# Patient Record
Sex: Female | Born: 1960 | Race: Black or African American | Hispanic: No | Smoking: Never smoker
Health system: Southern US, Community
[De-identification: ages and names within clinical notes are randomized; demographics above are authoritative.]

## PROBLEM LIST (undated history)

## (undated) DIAGNOSIS — R600 Localized edema: Secondary | ICD-10-CM

## (undated) DIAGNOSIS — M1990 Unspecified osteoarthritis, unspecified site: Secondary | ICD-10-CM

## (undated) DIAGNOSIS — R7989 Other specified abnormal findings of blood chemistry: Secondary | ICD-10-CM

## (undated) DIAGNOSIS — R7303 Prediabetes: Secondary | ICD-10-CM

## (undated) DIAGNOSIS — I10 Essential (primary) hypertension: Secondary | ICD-10-CM

## (undated) DIAGNOSIS — T7840XA Allergy, unspecified, initial encounter: Secondary | ICD-10-CM

## (undated) DIAGNOSIS — S83209A Unspecified tear of unspecified meniscus, current injury, unspecified knee, initial encounter: Secondary | ICD-10-CM

## (undated) DIAGNOSIS — K219 Gastro-esophageal reflux disease without esophagitis: Secondary | ICD-10-CM

## (undated) DIAGNOSIS — M2550 Pain in unspecified joint: Secondary | ICD-10-CM

## (undated) DIAGNOSIS — M549 Dorsalgia, unspecified: Secondary | ICD-10-CM

## (undated) HISTORY — DX: Pain in unspecified joint: M25.50

## (undated) HISTORY — DX: Unspecified osteoarthritis, unspecified site: M19.90

## (undated) HISTORY — DX: Prediabetes: R73.03

## (undated) HISTORY — DX: Other specified abnormal findings of blood chemistry: R79.89

## (undated) HISTORY — DX: Allergy, unspecified, initial encounter: T78.40XA

## (undated) HISTORY — DX: Localized edema: R60.0

## (undated) HISTORY — DX: Essential (primary) hypertension: I10

## (undated) HISTORY — DX: Gastro-esophageal reflux disease without esophagitis: K21.9

## (undated) HISTORY — DX: Dorsalgia, unspecified: M54.9

## (undated) HISTORY — DX: Unspecified tear of unspecified meniscus, current injury, unspecified knee, initial encounter: S83.209A

## (undated) HISTORY — PX: GANGLION CYST EXCISION: SHX1691

## (undated) HISTORY — PX: TUBAL LIGATION: SHX77

## (undated) HISTORY — PX: HERNIA REPAIR: SHX51

---

## 2005-12-13 ENCOUNTER — Emergency Department (HOSPITAL_COMMUNITY): Admission: EM | Admit: 2005-12-13 | Discharge: 2005-12-13 | Payer: Self-pay | Admitting: Family Medicine

## 2008-07-25 ENCOUNTER — Encounter: Admission: RE | Admit: 2008-07-25 | Discharge: 2008-07-25 | Payer: Self-pay | Admitting: Internal Medicine

## 2011-03-24 ENCOUNTER — Emergency Department: Payer: Self-pay | Admitting: *Deleted

## 2014-03-14 ENCOUNTER — Emergency Department (HOSPITAL_COMMUNITY)
Admission: EM | Admit: 2014-03-14 | Discharge: 2014-03-14 | Disposition: A | Discharge status: Home / Self Care | Payer: No Typology Code available for payment source | Attending: Emergency Medicine | Admitting: Emergency Medicine

## 2014-03-14 ENCOUNTER — Emergency Department (HOSPITAL_COMMUNITY): Payer: No Typology Code available for payment source

## 2014-03-14 DIAGNOSIS — Z87828 Personal history of other (healed) physical injury and trauma: Secondary | ICD-10-CM | POA: Insufficient documentation

## 2014-03-14 DIAGNOSIS — Z880 Allergy status to penicillin: Secondary | ICD-10-CM | POA: Insufficient documentation

## 2014-03-14 DIAGNOSIS — M25561 Pain in right knee: Secondary | ICD-10-CM

## 2014-03-14 DIAGNOSIS — IMO0002 Reserved for concepts with insufficient information to code with codable children: Secondary | ICD-10-CM | POA: Insufficient documentation

## 2014-03-14 DIAGNOSIS — E669 Obesity, unspecified: Secondary | ICD-10-CM | POA: Insufficient documentation

## 2014-03-14 DIAGNOSIS — M1990 Unspecified osteoarthritis, unspecified site: Secondary | ICD-10-CM

## 2014-03-14 DIAGNOSIS — M1710 Unilateral primary osteoarthritis, unspecified knee: Secondary | ICD-10-CM | POA: Insufficient documentation

## 2014-03-14 MED ORDER — NAPROXEN 375 MG PO TABS: 375.0000 mg | ORAL_TABLET | Freq: Two times a day (BID) | ORAL | Status: DC

## 2014-03-14 NOTE — ED Notes (Signed)
Patient transported to X-ray 

## 2014-03-14 NOTE — Progress Notes (Signed)
  CARE MANAGEMENT ED NOTE 03/14/2014  Patient:  Becky Hall,Becky Hall   Account Number:  [id number]  Date Initiated:  03/14/2014  Documentation initiated by:  Radford PaxFERRERO,Arvis Miguez  Subjective/Objective Assessment:   Patient presents to ED with severe right knee pain.     Subjective/Objective Assessment Detail:     Action/Plan:   Action/Plan Detail:   Anticipated DC Date:  03/14/2014     Status Recommendation to Physician:   Result of Recommendation:    Other ED Services  Consult Working Plan    DC Planning Services  Other  PCP issues    Choice offered to / List presented to:            Status of service:  Completed, signed off  ED Comments:   ED Comments Detail:  EDCM spoke to patient at bedside.  As per patient [place]  System updated.

## 2014-03-14 NOTE — ED Notes (Signed)
Pt states burning knee pain on left side since last Tuesday.  Pt took fluid pain and it went away. Now having pain in right leg with swelling.  No heat to touch.

## 2014-03-14 NOTE — ED Provider Notes (Signed)
CSN: [id number]     Arrival date & time 03/14/14  1236 History  This chart was scribed for non-physician practitioner, Raymon MuttonMarissa Tymothy Cass, PA-C,working with Raeford RazorStephen Kohut, MD, by Karle PlumberJennifer Tensley, ED Scribe.  This patient was seen in room WTR7/WTR7 and the patient's care was started at 2:18 PM.  Chief Complaint  Patient presents with  . Knee Pain   The history is provided by the patient. No language interpreter was used.   HPI Comments:  Becky GoodnightWanda K Hall is a 53 y.o. obese female who presents to the Emergency Department complaining of severe right knee pain that started approximately one week ago. Pt states she started having the pain about two months ago but the symptoms went away. She states she recently helped move her mother in with her. She reports the pain is burning and feels like something sticking in it. She reports associated mild swelling and numbness of the knee. She states she fell on the knee several years ago, but has not had trouble with it in a very long time. She states she has taken a fluid pill with mild relief. She reports taking Ibuprofen, Tylenol Arthritis, Aleve, and tramadol with mild relief. She denies CP, SOB, or difficulty breathing, or hip pain. Denies any new trauma or injury. She states she does not currently have a PCP but is in the process of getting one.    No past medical history on file. No past surgical history on file. No family history on file. History  Substance Use Topics  . Smoking status: Not on file  . Smokeless tobacco: Not on file  . Alcohol Use: Not on file   OB History   No data available     Review of Systems  Respiratory: Negative for shortness of breath.   Cardiovascular: Negative for chest pain.  Musculoskeletal: Positive for joint swelling (right knee).  Neurological: Positive for numbness.  All other systems reviewed and are negative.   Allergies  Penicillins  Home Medications   Prior to Admission medications   Not on File    Triage Vitals: BP 122/85  Pulse 104  Temp(Src) 98.1 F (36.7 C) (Oral)  Resp 18  SpO2 96% Physical Exam  Nursing note and vitals reviewed. Constitutional: She is oriented to person, place, and time. She appears well-developed and well-nourished. No distress.  HENT:  Head: Normocephalic and atraumatic.  Mouth/Throat: Oropharynx is clear and moist. No oropharyngeal exudate.  Eyes: Conjunctivae and EOM are normal. Pupils are equal, round, and reactive to light. Right eye exhibits no discharge. Left eye exhibits no discharge.  Neck: Normal range of motion. Neck supple. No tracheal deviation present.  Cardiovascular: Normal rate, regular rhythm and normal heart sounds.   Pulses:      Radial pulses are 2+ on the right side, and 2+ on the left side.       Dorsalis pedis pulses are 2+ on the right side, and 2+ on the left side.       Posterior tibial pulses are 2+ on the right side, and 2+ on the left side.  Cap refill less than 3 seconds  Pulmonary/Chest: Effort normal and breath sounds normal. No respiratory distress. She has no wheezes. She has no rales.  Musculoskeletal: Normal range of motion. She exhibits tenderness.       Legs: Mild swelling identified to the medial aspect of the proximal right tib-fib region with negative erythema, warmth upon palpation, inflammation, discoloration. Soft upon palpation with negative pain. Negative findings of abscess.  Full range of motion to the right knee. Negative discomfort to the right knee upon palpation circumferentially. Stable right knee joint. Negative valgus and varus tension. Full range of motion to the digits of the right foot without difficulty.  Lymphadenopathy:    She has no cervical adenopathy.  Neurological: She is alert and oriented to person, place, and time. No cranial nerve deficit. She exhibits normal muscle tone. Coordination normal.  Cranial nerves III-XII grossly intact Strength 5+/5+ to upper and lower extremities bilaterally  with resistance applied, equal distribution noted Sensation intact with differentiation sharp and dull touch Gait proper, proper balance - negative sway, negative drift, negative step-offs  Skin: Skin is warm and dry. She is not diaphoretic.  Psychiatric: She has a normal mood and affect. Her behavior is normal.    ED Course  Procedures (including critical care time) DIAGNOSTIC STUDIES: Oxygen Saturation is 96% on RA, normal by my interpretation.   COORDINATION OF CARE: 2:25 PM- Will speak with Dr. Juleen ChinaKohut about appropriate course of treatment. Pt verbalizes understanding and agrees to plan.  Medications - No data to display  Labs Review Labs Reviewed - No data to display  Imaging Review Dg Knee Complete 4 Views Right  03/14/2014   CLINICAL DATA:  Right knee pain, swelling  EXAM: RIGHT KNEE - COMPLETE 4+ VIEW  COMPARISON:  None.  FINDINGS: Right knee medial compartment osteoarthritis noted with joint space loss and bony spurring. No malalignment, displaced fracture or effusion. Obese body habitus noted.  IMPRESSION: Right knee medial compartment osteoarthritis. No acute finding by an plain radiography   Electronically Signed   By: Ruel Favorsrevor  Shick M.D.   On: 03/14/2014 14:50     EKG Interpretation None      MDM   Final diagnoses:  Osteoarthritis  Right knee pain    Filed Vitals:   03/14/14 1255  BP: 122/85  Pulse: 104  Temp: 98.1 F (36.7 C)  TempSrc: Oral  Resp: 18  SpO2: 96%    I personally performed the services described in this documentation, which was scribed in my presence. The recorded information has been reviewed and is accurate.  Patient presenting to the ED with right knee pain that started approximately one week ago. Stated that she was moving her mother was moving stuff around that could have exacerbated pain. Stated the pain is localized just below her right knee described as a burning, sticking needle sensation without radiation. Stated that she's been  using Aleve, ibuprofen, Tylenol arthritis with minimal relief. Stated that she's noticed mild swelling. Denies skin trouble or changes, warmth, fall, trauma, numbness, tingling, loss of sensation. Alert and oriented. GCS 15. Heart rate and rhythm normal. Lungs clear to auscultation. Radial and DP pulses 2+ bilaterally, PT pulses 2+. Cap refill less than 3 seconds. Negative deformities identified to the right knee-negative pain upon palpation circumferentially. Negative findings of septic joint. Mild swelling identified to the medial aspect of the proximal tib-fib region of the right leg. Negative pain upon palpation-negative erythema, inflammation, warmth upon palpation, cellulitic findings. Negative findings of abscess. Sensation intact with differentiation sharp and dull touch. Strength intact with equal distribution. Full range of motion to the right knee. Stable knee joint. Full range of motion to the digits of the right foot. Plain film of right knee noted medial compartment osteoarthritis-no acute finding for osseous injury. Doubt tibial plateau fracture. Doubt acute bony abnormality. Doubt septic joint. Patient presenting to the ED with osteoarthritis-suspicious swelling secondary to osteoarthritis. Patient stable, afebrile.  Patient neurovascularly intact. Patient placed in the sleeve for comfort. Discharged patient. Referred patient to orthopedics. Discussed with patient to rest, ice, elevate. Discharge patient with anti-inflammatories. Discussed with patient to closely monitor symptoms and if symptoms are to worsen or change to report back to the ED - strict return instructions given.  Patient agreed to plan of care, understood, all questions answered.   Raymon Mutton, PA-C 03/14/14 2155

## 2014-03-14 NOTE — Discharge Instructions (Signed)
Please call your doctor for a followup appointment within 24-48 hours. When you talk to your doctor please let them know that you were seen in the emergency department and have them acquire all of your records so that they can discuss the findings with you and formulate a treatment plan to fully care for your new and ongoing problems. Please call and set-up an appointment with orthopedics to be assessed regarding osteoarthritis in the right knee Please apply ice and massage with icy hot ointment Please take medications as prescribed-while on these medications please do not take any the or ibuprofen for this can increase the risk of GI bleeds Please continue to monitor symptoms closely and if symptoms are to worsen or change (fever greater than 101, chills, chest pain, shortness of breath, difficulty breathing, fall, injury, swelling to the leg, redness, numbness, tingling, warmth upon palpation, red streaks running up the leg) please report back to the ED immediately   Osteoarthritis Osteoarthritis is a disease that causes soreness and swelling (inflammation) of a joint. It occurs when the cartilage at the affected joint wears down. Cartilage acts as a cushion, covering the ends of bones where they meet to form a joint. Osteoarthritis is the most common form of arthritis. It often occurs in older people. The joints affected most often by this condition include those in the:  Ends of the fingers.  Thumbs.  Neck.  Lower back.  Knees.  Hips. CAUSES  Over time, the cartilage that covers the ends of bones begins to wear away. This causes bone to rub on bone, producing pain and stiffness in the affected joints.  RISK FACTORS Certain factors can increase your chances of having osteoarthritis, including:  Older age.  Excessive body weight.  Overuse of joints. SIGNS AND SYMPTOMS   Pain, swelling, and stiffness in the joint.  Over time, the joint may lose its normal shape.  Small deposits  of bone (osteophytes) may grow on the edges of the joint.  Bits of bone or cartilage can break off and float inside the joint space. This may cause more pain and damage. DIAGNOSIS  Your health care provider will do a physical exam and ask about your symptoms. Various tests may be ordered, such as:  X-rays of the affected joint.  An MRI scan.  Blood tests to rule out other types of arthritis.  Joint fluid tests. This involves using a needle to draw fluid from the joint and examining the fluid under a microscope. TREATMENT  Goals of treatment are to control pain and improve joint function. Treatment plans may include:  A prescribed exercise program that allows for rest and joint relief.  A weight control plan.  Pain relief techniques, such as:  Properly applied heat and cold.  Electric pulses delivered to nerve endings under the skin (transcutaneous electrical nerve stimulation, TENS).  Massage.  Certain nutritional supplements.  Medicines to control pain, such as:  Acetaminophen.  Nonsteroidal anti-inflammatory drugs (NSAIDs), such as naproxen.  Narcotic or central-acting agents, such as tramadol.  Corticosteroids. These can be given orally or as an injection.  Surgery to reposition the bones and relieve pain (osteotomy) or to remove loose pieces of bone and cartilage. Joint replacement may be needed in advanced states of osteoarthritis. HOME CARE INSTRUCTIONS   Only take over-the-counter or prescription medicines as directed by your health care provider. Take all medicines exactly as instructed.  Maintain a healthy weight. Follow your health care provider's instructions for weight control. This may include  dietary instructions.  Exercise as directed. Your health care provider can recommend specific types of exercise. These may include:  Strengthening exercises These are done to strengthen the muscles that support joints affected by arthritis. They can be performed  with weights or with exercise bands to add resistance.  Aerobic activities These are exercises, such as brisk walking or low-impact aerobics, that get your heart pumping.  Range-of-motion activities These keep your joints limber.  Balance and agility exercises These help you maintain daily living skills.  Rest your affected joints as directed by your health care provider.  Follow up with your health care provider as directed. SEEK MEDICAL CARE IF:   Your skin turns red.  You develop a rash in addition to your joint pain.  You have worsening joint pain. SEEK IMMEDIATE MEDICAL CARE IF:  You have a significant loss of weight or appetite.  You have a fever along with joint or muscle aches.  You have night sweats. FOR MORE INFORMATION  National Institute of Arthritis and Musculoskeletal and Skin Diseases: www.niams.http://www.myers.net/nih.gov [employer]ational Institute on Aging: https://walker.com/www.nia.nih.gov American College of Rheumatology: www.rheumatology.org Document Released: 11/11/2005 Document Revised: 09/01/2013 Document Reviewed: 07/19/2013 [facility] Patient Information 2014 [place], [place].   Emergency Department Resource Guide 1) Find a Doctor and Pay Out of Pocket Although you won't have to find out who is covered by your insurance plan, it is a good idea to ask around and get recommendations. You will then need to call the office and see if the doctor you have chosen will accept you as a new patient and what types of options they offer for patients who are self-pay. Some doctors offer discounts or will set up payment plans for their patients who do not have insurance, but you will need to ask so you aren't surprised when you get to your appointment.  2) Contact Your Local Health Department Not all health departments have doctors that can see patients for sick visits, but many do, so it is worth a call to see if yours does. If you don't know where your local health department is, you can check in your phone book.  The CDC also has a tool to help you locate your state's health department, and many state websites also have listings of all of their local health departments.  3) Find a Walk-in Clinic If your illness is not likely to be very severe or complicated, you may want to try a walk in clinic. These are popping up all over the country in pharmacies, drugstores, and shopping centers. They're usually staffed by nurse practitioners or physician assistants that have been trained to treat common illnesses and complaints. They're usually fairly quick and inexpensive. However, if you have serious medical issues or chronic medical problems, these are probably not your best option.  No Primary Care Doctor: - Call Health Connect at  [phone] - they can help you locate a primary care doctor that  accepts your insurance, provides certain services, etc. - Physician Referral Service- [phone]  Chronic Pain Problems: Organization         Address  Phone   Notes  [facility]  [phone] Patients need to be referred by their primary care doctor.   Medication Assistance: Organization         Address  Phone   Notes  [facility] Medication [facility] [address] [place], [place] [address] [phone] --Must be a resident of [facility] [place] -- Must have NO  insurance coverage whatsoever (no Medicaid/ Medicare, etc.) -- The pt. MUST have a primary care doctor that directs their care regularly and follows them in the community   Med[facility]ist  [phone]   [employer]  [phone]    Agencies that provide inexpensive medical care: Organization         Address  Phone   Notes  [facility]  [phone]   [facility]    [phone]   [facility] [address] [place], [place] [address] [phone]   [facility] [address], [place] [phone]   [facility]     [phone]   [facility]    [phone]   [facility]  [address], [place] Phone:  [phone], Fax:  [phone] Hours of Operation:  9 am - 6 pm, M-F.  Also accepts Medicaid/Medicare and self-pay.  [facility]  [address], [place] Phone: [phone], Fax: [phone]. Hours of Operation:  8:30 am - 5:30 pm, M-F.  Also accepts Medicaid and self-pay.  [facility] [place] [address], [place] Phone: [phone]) [phone]   [facility] [address], [place], [place] [phone], Ext. [phone] Mondays & Thursdays: 7-9 AM.  First 15 patients are seen on a first come, first serve basis.    Medicaid-accepting [facility] Providers:  Organization         Address  Phone   Notes  [facility] [address], [place] [phone] Also accepts self-pay patients.  [facility] [address], [place]  [phone]) [phone]   [facility] [address], [place] [phone]   [facility] [address] [place] [address], [place] [phone]   Renaye RakersVeita Bland [address], [place]   [phone] Only accepts [place] Access [place] patients after they have their name applied to their card.   Self-Pay (no insurance) in [place] [place]:  Organization         Address  Phone   Notes  Sickle Cell Patients, [facility] [address], [place] [phone]   [facility] [address], [place] [phone]   [facility] [place]  [address], [place] [phone]   [facility]/Dr. Osei-Bonsu  [address] [place] [place] [address], [place] or [phone], [place] [phone] Phone number for both [place] and [place] locations is the same.  [facility] and  [facility] [address], [place] [phone]   [facility] [place] [address] [place] [address], [place] or [address] [phone]   [facility] [address], [place] [phone], phone; [phone], fax Sees patients 1st and 3rd Saturday of every month.  Must not qualify for public or private insurance (i.e. Medicaid, Medicare, [place] Health Choice, Veterans' Benefits)  Household income should be no more than 200% of the poverty level The clinic cannot treat you if you are pregnant or think you are pregnant  Sexually transmitted diseases are not treated at the clinic.    Dental Care: Organization         Address  Phone  Notes  [facility]  Department of Public Health [facility] [address] [place], [place] [phone] Accepts children up to age 61 who are enrolled in [place] or [place] Health Choice; pregnant women with a Medicaid card; and children who have applied for Medicaid or [place] Health Choice, but were declined, whose parents can pay a reduced fee at time of service.  [facility] Department of [facility]  [address], [place] [phone] Accepts children up to age 22 who are enrolled in [place] or [place] Health Choice; pregnant women with a Medicaid card; and children who have applied for Medicaid or [place] Health Choice, but were declined, whose parents can pay a reduced fee at time of service.  [facility]  [address] [place], [place] [phone] Patients are seen by appointment only. Walk-ins are not accepted. [facility] will see patients 32 years of age and older. Monday - Tuesday (8am-5pm) Most Wednesdays (8:30-5pm) $30 per visit, cash only  [facility]  [address], [facility] [phone] Patients are seen by appointment only. Walk-ins are not accepted. [facility] will see patients 29 years of age and  older. One Wednesday Evening (Monthly: Volunteer Based).  $30 per visit, cash only  [employer]  [phone] for adults; Children under age 66, call Graduate Pediatric Dentistry at [phone]. Children aged 93-14, please call [phone] to request a pediatric application.  Dental services are provided in all areas of dental care including fillings, crowns and bridges, complete and partial dentures, implants, gum treatment, root canals, and extractions. Preventive care is also provided. Treatment is provided to both adults and children. Patients are selected via a lottery and there is often a waiting list.   [facility] [address] [place]  [phone] www.drcivils.com   [facility] [address] [place], [place] [phone], Ext. [phone] Second and Fourth Thursday of each month, opens at 6:30 AM; Clinic ends at 9 AM.  Patients are seen on a first-come first-served basis, and a limited number are seen during each clinic.   [facility]  [address] [place], [place] [phone]   Eligibility Requirements You must have lived in [place], [place], or [place] counties for at least the last three months.   You cannot be eligible for state or federal sponsored [place], including [employer], [place], or [employer].   You generally cannot be eligible for healthcare insurance through your employer.    How to apply: Eligibility screenings are held every Tuesday and Wednesday afternoon from 1:00 pm until 4:00 pm. You do not need an appointment for the interview!  [facility] [address] [place], [place] [phone]   [facility]  [phone]   [facility] Health Department  [phone]   [facility] Health Department  [phone]    Behavioral Health Resources in the Community: Intensive Outpatient Programs Organization          Address  Phone  Notes  [facility] [address], [place], [place] [phone]   [facility] Outpatient [address], [place], [place] [phone]   [facility] [address] [place], [place]  [phone]   [facility] Mental Health [address]  [place], [place] [phone] or [phone]   Substance Abuse Resources Organization  Address  Phone  Notes  Alcohol [facility] Drug Services  [phone]   [facility]  [phone]   [facility] [place]  [phone]   Floydene Flock  [phone]   Residential & Outpatient Substance Abuse Program  [phone]   Psychological Services Organization         Address  Phone  Notes  [facility] Health  [phone]   [facility]  [phone]   [facility] Mental [facility] [address] [place] [phone] or [phone]    Mobile Crisis Teams Organization         Address  Phone  Notes  [facility], Mobile Crisis Care Unit  [phone]   [facility]  [address] [place], [place] [phone]   Doristine Locks [address] [place] [phone]    Self-Help/Support Groups Organization         Address  Phone             Notes  [facility]oc[facility] of [place] - variety of support groups  [phone]- I7437963 Call for more information  Narcotics Anonymous (NA), [facility] [address], [employer] [place]  2 meetings at this location   Statistician         Address  Phone  Notes  [facility] [address],    [place]  [phone]   [facility]  [address] [place] [address], [place], [place] [phone]   [facility] [address] [place], [place] [phone] Admissions: 8am-3pm M-F  [facility] [address],    [place], [place] [phone]   [facility] [address] [place], [place], [place] [phone]   [facility] [address],  [place], [place] [phone]   [facility] - Intensive Outpatient [address], [place], [place] [phone]   [facility] ([facility] [address] [place].,  [place], [place] [phone] or [phone]   [facility] ([facility]) [address], [place], [place] [phone] Accepts Medicaid  [facility] [address]  [place] [phone] Substance Abuse/Addiction Treatment   [facility] Organization         Address  Phone  Notes  [facility]  [phone]   Angie Fava, PhD [address] [place], [place]   7184524950 or [phone]   [facility]   [address] [place], [place] [phone]   [facility] [address], [place], [place] [phone] Insurance/Medicaid/sponsorship through [facility] Families [address]                                    [place], [place] [phone] Therapy/tele-psych/case  [facility] [address], [place] [phone]    Dr. Lolly Mustache  [phone]   [facility]  [employer] [facility] Behavioral Health Dept. 1) [address], [place] 2) [address] [place] 3)  [address], [place] [phone]  [phone]   [facility] Child Abuse Hotline 951 445 5794 or [phone] (After Hours)

## 2014-03-15 NOTE — ED Provider Notes (Signed)
Medical screening examination/treatment/procedure(s) were performed by non-physician practitioner and as supervising physician I was immediately available for consultation/collaboration.   EKG Interpretation None       Jule Schlabach, MD 03/15/14 0700 

## 2015-02-10 ENCOUNTER — Other Ambulatory Visit: Payer: Self-pay | Admitting: Podiatry

## 2015-02-10 ENCOUNTER — Ambulatory Visit (INDEPENDENT_AMBULATORY_CARE_PROVIDER_SITE_OTHER): Payer: No Typology Code available for payment source

## 2015-02-10 ENCOUNTER — Ambulatory Visit (INDEPENDENT_AMBULATORY_CARE_PROVIDER_SITE_OTHER): Payer: No Typology Code available for payment source | Admitting: Podiatry

## 2015-02-10 ENCOUNTER — Encounter: Payer: Self-pay | Admitting: Podiatry

## 2015-02-10 VITALS — BP 120/89 | HR 96 | Resp 16

## 2015-02-10 DIAGNOSIS — M722 Plantar fascial fibromatosis: Secondary | ICD-10-CM

## 2015-02-10 DIAGNOSIS — S92912A Unspecified fracture of left toe(s), initial encounter for closed fracture: Secondary | ICD-10-CM

## 2015-02-10 DIAGNOSIS — M79673 Pain in unspecified foot: Secondary | ICD-10-CM

## 2015-02-10 MED ORDER — TRIAMCINOLONE ACETONIDE 10 MG/ML IJ SUSP
10.0000 mg | Freq: Once | INTRAMUSCULAR | Status: AC
  Administered 2015-02-10: 10 mg

## 2015-02-10 MED ORDER — DICLOFENAC SODIUM 75 MG PO TBEC: 75.0000 mg | DELAYED_RELEASE_TABLET | Freq: Two times a day (BID) | ORAL | Status: DC

## 2015-02-10 NOTE — Patient Instructions (Signed)

## 2015-02-10 NOTE — Progress Notes (Signed)
   Subjective:    Patient ID: Becky Hall, female    DOB: 1961-10-17, 54 y.o.   MRN: [id number]  HPI Comments: "I hurt my toe and the heel hurts on the other side"  Patient c/o tender 4th toe and forefoot left since Feb 10th. She hit her toe and its red and swollen.   Also, c/o plantar heel right for few weeks. She thinks the heel started to hurt because of putting too much pressure on it to compensate for left. AM pain.   Foot Pain      Review of Systems  HENT: Positive for sinus pressure.   Cardiovascular: Positive for leg swelling.  All other systems reviewed and are negative.      Objective:   Physical Exam        Assessment & Plan:

## 2015-02-10 NOTE — Progress Notes (Signed)
Subjective:     Patient ID: Becky Hall, female   DOB: [birth year], 54 y.o.   MRN: [id number]  HPI patient states I traumatized my left big toe in the last couple of weeks and my heel has started to hurt me over the last several weeks probably due to walking differently. States that she banged her toe into a wall and it's been sore and swollen since and that her right heel has been very tender for these last 2 weeks   Review of Systems  All other systems reviewed and are negative.      Objective:   Physical Exam  Constitutional: She is oriented to person, place, and time.  Cardiovascular: Intact distal pulses.   Musculoskeletal: Normal range of motion.  Neurological: She is oriented to person, place, and time.  Skin: Skin is warm.  Nursing note and vitals reviewed.  neurovascular status found to be intact with muscle strength adequate and range of motion within normal limits. Patient's noted to have an edematous fourth toe left it's painful when pressed and pain in the plantar aspect of the right heel at the insertion of the tendon into the calcaneus. Significant flatfoot deformity is noted bilateral and patient is noted to have good digital perfusion and is well oriented 3     Assessment:     Probable fracture fourth toe left with plantar fasciitis of an acute nature right secondary to foot structure    Plan:     H&P and x-rays reviewed of feet. Discussed fracture toe and the fact it should heal based on where it is. The plantar heel right I injected 3 mg Kenalog 5 mg Xylocaine dispensed a fascially brace with instructions on usage and placed on diclofenac 75 mg twice a day. Gave instructions on physical therapy and supportive shoe gear usage and reappoint in 2 weeks

## 2015-02-24 ENCOUNTER — Encounter: Payer: Self-pay | Admitting: Podiatry

## 2015-02-24 ENCOUNTER — Ambulatory Visit (INDEPENDENT_AMBULATORY_CARE_PROVIDER_SITE_OTHER): Payer: No Typology Code available for payment source | Admitting: Podiatry

## 2015-02-24 VITALS — BP 129/90 | HR 84 | Resp 16

## 2015-02-24 DIAGNOSIS — M722 Plantar fascial fibromatosis: Secondary | ICD-10-CM | POA: Diagnosis not present

## 2015-02-27 NOTE — Progress Notes (Signed)
Subjective:     Patient ID: Becky Hall, female   DOB: Oct 25, 1961, 54 y.o.   MRN: [id number]  HPI patient states it's feeling much better on my right foot and my left fourth toe while still swollen is improving. States that she's able to walk with minimal discomfort at this time   Review of Systems     Objective:   Physical Exam Neurovascular status is intact with no change in health history and discomfort of the plantar heel right which is improved quite a bit upon palpation with mild swelling still noted left fourth toe    Assessment:     Plantar fasciitis right which is improving and fractured fourth toe left which is gradually getting better    Plan:     Advised on continued Y choose left and physical therapy and support for the right. If symptoms persist patient be seen back and may require more aggressive treatment plan

## 2015-04-14 ENCOUNTER — Telehealth: Payer: Self-pay | Admitting: *Deleted

## 2015-04-14 ENCOUNTER — Encounter: Payer: Self-pay | Admitting: *Deleted

## 2015-04-14 NOTE — Telephone Encounter (Signed)
Faxed patients return to work note to [phone]

## 2015-04-29 ENCOUNTER — Other Ambulatory Visit: Payer: Self-pay | Admitting: Podiatry

## 2015-08-15 ENCOUNTER — Encounter: Payer: Self-pay | Admitting: Podiatry

## 2015-08-15 ENCOUNTER — Ambulatory Visit (INDEPENDENT_AMBULATORY_CARE_PROVIDER_SITE_OTHER): Payer: Self-pay | Admitting: Podiatry

## 2015-08-15 ENCOUNTER — Ambulatory Visit (INDEPENDENT_AMBULATORY_CARE_PROVIDER_SITE_OTHER): Payer: Self-pay

## 2015-08-15 VITALS — BP 130/91 | HR 93 | Resp 17

## 2015-08-15 DIAGNOSIS — M79671 Pain in right foot: Secondary | ICD-10-CM

## 2015-08-15 DIAGNOSIS — M722 Plantar fascial fibromatosis: Secondary | ICD-10-CM

## 2015-08-15 MED ORDER — MELOXICAM 15 MG PO TABS: 15.0000 mg | ORAL_TABLET | Freq: Every day | ORAL | Status: DC

## 2015-08-15 NOTE — Progress Notes (Signed)
Patient ID: Becky Hall, female   DOB: 1961-08-11, 54 y.o.   MRN: [id number]  Subjective:  54 year old female presents to the office they with concerns of right heel pain which is started to recur of the left couple weeks. She states the pain for about the she changes shoes at a new job. She previously was treated for plantar fasciitis in February of this year and she states that she feels the same pain that she did then. She denies any history of injury or trauma. Denies any swelling or redness. No tingling or numbness. The pain does not wake her up at night. She has pain in the mornings when she first gets up or after being on her feet for long periods of time at work. No other complaints at this time in no acute changes otherwise.  Objective: AAO x3, NAD DP/PT pulses palpable bilaterally, CRT less than 3 seconds Protective sensation intact with Simms Weinstein monofilament, vibratory sensation intact, Achilles tendon reflex intact Tenderness to palpation overlying the plantar medial tubercle of the calcaneus to the right heel at the insertion of the plantar fascia. There is no pain along the course of plantar fascial within the arch of the foot. There is no pain with lateral compression of the calcaneus or pain the vibratory sensation. No pain on the posterior aspect of the calcaneus or along the course/insertion of the Achilles tendon. There is no overlying edema, erythema, increase in warmth. No other areas of tenderness palpation or pain with vibratory sensation to the foot/ankle. MMT 5/5, ROM WNL No open lesions or pre-ulcerative lesions are identified. No pain with calf compression, swelling, warmth, erythema.  Assessment: 54 year old female with likely recurrence of right plantar fasciitis  Plan: -Treatment options discussed including all alternatives, risks, and complications -Patient elects to proceed with steroid injection into the right heel. Under sterile skin preparation, a  total of 2.5cc of kenalog 10, 0.5% Marcaine plain, and 2% lidocaine plain were infiltrated into the symptomatic area without complication. A band-aid was applied. Patient tolerated the injection well without complication. Post-injection care with discussed with the patient. Discussed with the patient to ice the area over the next couple of days to help prevent a steroid flare.  -Continue of plantar fascial brace for Ciardi has. -Prescribed mobic. Discussed side effects of the medication and directed to stop if any are to occur and call the office.  -Icing and stretching exercises daily. -Continue with supportive shoe gear. -I discussed with her orthotics. -Follow-up in 4 weeks or sooner if any problems arise. In the meantime, encouraged to call the office with any questions, concerns, change in symptoms.   Ovid Curd, DPM

## 2015-08-15 NOTE — Patient Instructions (Signed)
Plantar Fasciitis (Heel Spur Syndrome) with Rehab The plantar fascia is a fibrous, ligament-like, soft-tissue structure that spans the bottom of the foot. Plantar fasciitis is a condition that causes pain in the foot due to inflammation of the tissue. SYMPTOMS   Pain and tenderness on the underneath side of the foot.  Pain that worsens with standing or walking. CAUSES  Plantar fasciitis is caused by irritation and injury to the plantar fascia on the underneath side of the foot. Common mechanisms of injury include:  Direct trauma to bottom of the foot.  Damage to a small nerve that runs under the foot where the main fascia attaches to the heel bone.  Stress placed on the plantar fascia due to bone spurs. RISK INCREASES WITH:   Activities that place stress on the plantar fascia (running, jumping, pivoting, or cutting).  Poor strength and flexibility.  Improperly fitted shoes.  Tight calf muscles.  Flat feet.  Failure to warm-up properly before activity.  Obesity. PREVENTION  Warm up and stretch properly before activity.  Allow for adequate recovery between workouts.  Maintain physical fitness:  Strength, flexibility, and endurance.  Cardiovascular fitness.  Maintain a health body weight.  Avoid stress on the plantar fascia.  Wear properly fitted shoes, including arch supports for individuals who have flat feet. PROGNOSIS  If treated properly, then the symptoms of plantar fasciitis usually resolve without surgery. However, occasionally surgery is necessary. RELATED COMPLICATIONS   Recurrent symptoms that may result in a chronic condition.  Problems of the lower back that are caused by compensating for the injury, such as limping.  Pain or weakness of the foot during push-off following surgery.  Chronic inflammation, scarring, and partial or complete fascia tear, occurring more often from repeated injections. TREATMENT  Treatment initially involves the use of  ice and medication to help reduce pain and inflammation. The use of strengthening and stretching exercises may help reduce pain with activity, especially stretches of the Achilles tendon. These exercises may be performed at home or with a therapist. Your caregiver may recommend that you use heel cups of arch supports to help reduce stress on the plantar fascia. Occasionally, corticosteroid injections are given to reduce inflammation. If symptoms persist for greater than 6 months despite non-surgical (conservative), then surgery may be recommended.  MEDICATION   If pain medication is necessary, then nonsteroidal anti-inflammatory medications, such as aspirin and ibuprofen, or other minor pain relievers, such as acetaminophen, are often recommended.  Do not take pain medication within 7 days before surgery.  Prescription pain relievers may be given if deemed necessary by your caregiver. Use only as directed and only as much as you need.  Corticosteroid injections may be given by your caregiver. These injections should be reserved for the most serious cases, because they may only be given a certain number of times. HEAT AND COLD  Cold treatment (icing) relieves pain and reduces inflammation. Cold treatment should be applied for 10 to 15 minutes every 2 to 3 hours for inflammation and pain and immediately after any activity that aggravates your symptoms. Use ice packs or massage the area with a piece of ice (ice massage).  Heat treatment may be used prior to performing the stretching and strengthening activities prescribed by your caregiver, physical therapist, or athletic trainer. Use a heat pack or soak the injury in warm water. SEEK IMMEDIATE MEDICAL CARE IF:  Treatment seems to offer no benefit, or the condition worsens.  Any medications produce adverse side effects. EXERCISES RANGE   OF MOTION (ROM) AND STRETCHING EXERCISES - Plantar Fasciitis (Heel Spur Syndrome) These exercises may help you  when beginning to rehabilitate your injury. Your symptoms may resolve with or without further involvement from your physician, physical therapist or athletic trainer. While completing these exercises, remember:   Restoring tissue flexibility helps normal motion to return to the joints. This allows healthier, less painful movement and activity.  An effective stretch should be held for at least 30 seconds.  A stretch should never be painful. You should only feel a gentle lengthening or release in the stretched tissue. RANGE OF MOTION - Toe Extension, Flexion  Sit with your right / left leg crossed over your opposite knee.  Grasp your toes and gently pull them back toward the top of your foot. You should feel a stretch on the bottom of your toes and/or foot.  Hold this stretch for __________ seconds.  Now, gently pull your toes toward the bottom of your foot. You should feel a stretch on the top of your toes and or foot.  Hold this stretch for __________ seconds. Repeat __________ times. Complete this stretch __________ times per day.  RANGE OF MOTION - Ankle Dorsiflexion, Active Assisted  Remove shoes and sit on a chair that is preferably not on a carpeted surface.  Place right / left foot under knee. Extend your opposite leg for support.  Keeping your heel down, slide your right / left foot back toward the chair until you feel a stretch at your ankle or calf. If you do not feel a stretch, slide your bottom forward to the edge of the chair, while still keeping your heel down.  Hold this stretch for __________ seconds. Repeat __________ times. Complete this stretch __________ times per day.  STRETCH - Gastroc, Standing  Place hands on wall.  Extend right / left leg, keeping the front knee somewhat bent.  Slightly point your toes inward on your back foot.  Keeping your right / left heel on the floor and your knee straight, shift your weight toward the wall, not allowing your back to  arch.  You should feel a gentle stretch in the right / left calf. Hold this position for __________ seconds. Repeat __________ times. Complete this stretch __________ times per day. STRETCH - Soleus, Standing  Place hands on wall.  Extend right / left leg, keeping the other knee somewhat bent.  Slightly point your toes inward on your back foot.  Keep your right / left heel on the floor, bend your back knee, and slightly shift your weight over the back leg so that you feel a gentle stretch deep in your back calf.  Hold this position for __________ seconds. Repeat __________ times. Complete this stretch __________ times per day. STRETCH - Gastrocsoleus, Standing  Note: This exercise can place a lot of stress on your foot and ankle. Please complete this exercise only if specifically instructed by your caregiver.   Place the ball of your right / left foot on a step, keeping your other foot firmly on the same step.  Hold on to the wall or a rail for balance.  Slowly lift your other foot, allowing your body weight to press your heel down over the edge of the step.  You should feel a stretch in your right / left calf.  Hold this position for __________ seconds.  Repeat this exercise with a slight bend in your right / left knee. Repeat __________ times. Complete this stretch __________ times per day.    STRENGTHENING EXERCISES - Plantar Fasciitis (Heel Spur Syndrome)  These exercises may help you when beginning to rehabilitate your injury. They may resolve your symptoms with or without further involvement from your physician, physical therapist or athletic trainer. While completing these exercises, remember:   Muscles can gain both the endurance and the strength needed for everyday activities through controlled exercises.  Complete these exercises as instructed by your physician, physical therapist or athletic trainer. Progress the resistance and repetitions only as guided. STRENGTH -  Towel Curls  Sit in a chair positioned on a non-carpeted surface.  Place your foot on a towel, keeping your heel on the floor.  Pull the towel toward your heel by only curling your toes. Keep your heel on the floor.  If instructed by your physician, physical therapist or athletic trainer, add ____________________ at the end of the towel. Repeat __________ times. Complete this exercise __________ times per day. STRENGTH - Ankle Inversion  Secure one end of a rubber exercise band/tubing to a fixed object (table, pole). Loop the other end around your foot just before your toes.  Place your fists between your knees. This will focus your strengthening at your ankle.  Slowly, pull your big toe up and in, making sure the band/tubing is positioned to resist the entire motion.  Hold this position for __________ seconds.  Have your muscles resist the band/tubing as it slowly pulls your foot back to the starting position. Repeat __________ times. Complete this exercises __________ times per day.  Document Released: 11/11/2005 Document Revised: 02/03/2012 Document Reviewed: 02/23/2009 Exit[employer] Patient Information 2015 [employer]. This information is not intended to replace advice given to you by your health care Becky Hall. Make sure you discuss any questions you have with your health care Becky Hall.  

## 2015-08-15 NOTE — Addendum Note (Signed)
Addended by: Ovid Curd R on: 08/15/2015 12:52 PM   Modules accepted: Orders

## 2015-08-22 ENCOUNTER — Ambulatory Visit: Payer: No Typology Code available for payment source | Admitting: Podiatry

## 2015-09-05 ENCOUNTER — Ambulatory Visit: Payer: Self-pay | Admitting: Podiatry

## 2015-09-07 ENCOUNTER — Ambulatory Visit: Payer: Self-pay | Admitting: Podiatry

## 2015-10-23 ENCOUNTER — Encounter: Payer: Self-pay | Admitting: Podiatry

## 2015-10-23 ENCOUNTER — Ambulatory Visit (INDEPENDENT_AMBULATORY_CARE_PROVIDER_SITE_OTHER): Payer: BLUE CROSS/BLUE SHIELD | Admitting: Podiatry

## 2015-10-23 ENCOUNTER — Ambulatory Visit: Payer: Self-pay | Admitting: Podiatry

## 2015-10-23 ENCOUNTER — Ambulatory Visit (INDEPENDENT_AMBULATORY_CARE_PROVIDER_SITE_OTHER): Payer: BLUE CROSS/BLUE SHIELD | Admitting: Internal Medicine

## 2015-10-23 VITALS — BP 117/79 | HR 99 | Resp 16

## 2015-10-23 VITALS — BP 108/72 | HR 101 | Temp 98.1°F | Resp 18 | Ht 66.0 in | Wt 227.0 lb

## 2015-10-23 DIAGNOSIS — Z111 Encounter for screening for respiratory tuberculosis: Secondary | ICD-10-CM | POA: Diagnosis not present

## 2015-10-23 DIAGNOSIS — M722 Plantar fascial fibromatosis: Secondary | ICD-10-CM | POA: Diagnosis not present

## 2015-10-23 DIAGNOSIS — Z7189 Other specified counseling: Secondary | ICD-10-CM | POA: Diagnosis not present

## 2015-10-23 DIAGNOSIS — M79671 Pain in right foot: Secondary | ICD-10-CM

## 2015-10-23 MED ORDER — TRIAMCINOLONE ACETONIDE 10 MG/ML IJ SUSP
10.0000 mg | Freq: Once | INTRAMUSCULAR | Status: AC
  Administered 2015-10-23: 10 mg

## 2015-10-23 NOTE — Patient Instructions (Signed)
Tuberculin Skin Test WHY AM I HAVING THIS TEST? Tuberculosis (TB) is a bacterial infection caused by Mycobacterium tuberculosis. Most people who are exposed to these bacteria have a strong enough defense (immune) system to prevent the bacteria from causing TB and developing symptoms. Their bodies prevent the germs from being active and making them sick (latent TB infection).  However, if you have TB germs in your body and your immune system is weak, you can develop a TB infection. This can cause symptoms such as:   Night sweats.  Fever.  Weakness.  Weight loss. A latent TB infection can also become active later in life if your immune system becomes weakened or compromised. You may have this test if your health care provider suspects that you have TB. You may also have this test to screen for TB if you are at risk for getting the disease. Those at increased risk include:  People who inject illegal drugs or share needles.  People with HIV or other diseases that affect immunity.  Health care workers.  People who live in high-risk communities, such as homeless shelters, nursing homes, and correctional facilities.  People who have been in contact with someone with TB.  People from countries where TB is more common. If you are in a high-risk group, your health care provider may wish to screen for TB more often. This can help prevent the spread of the disease. Sometimes TB screening is required when starting a new job, such as becoming a health care worker or a teacher. Colleges or universities may require it of new students. HOW WILL I BE TESTED? A tuberculin skin test is the main test used to check for exposure to the bacteria that can cause TB. The test checks for antibodies to the bacteria. Antibodies are proteins that your body produces to protect you from germs and other things that can make you sick. Your health care provider will inject a solution known as PPD (purified protein  derivative) under the first layer of skin on your arm. This causes a blister-like bubble to form at the site. Your health care provider will then examine the site after a number of hours have passed to see if a reaction has occurred. HOW DO I PREPARE FOR THE TEST? There is no preparation required for this test. WHAT DO THE RESULTS MEAN? Your test results will be reported as either negative or positive.  If the tuberculin skin test produces a negative result, it is likely that you do not have TB and have not been exposed to the TB bacteria. If you or your health care provider suspects exposure, however, you may want to repeat the test a few weeks later. A blood test may also be used to check for TB. This is because you will not react to the tuberculin skin test until several weeks after exposure to TB bacteria. If you test positive to the tuberculin skin test, it is likely that you have been exposed to TB bacteria. The test does not distinguish between an active and a latent TB infection. A false-positive result can occur. A false-positive result for TB bacteria is incorrect because it indicates a condition or finding is present when it is not. Talk to your health care provider to discuss your results, treatment options, and if necessary, the need for more tests. It is your responsibility to obtain your test results. Ask the lab or department performing the test when and how you will get your results. Talk with your   health care provider if you have any questions about your results.   This information is not intended to replace advice given to you by your health care provider. Make sure you discuss any questions you have with your health care provider.   Document Released: 08/21/2005 Document Revised: 12/02/2014 Document Reviewed: 03/07/2014 Elsevier Interactive Patient Education 2016 [employer]  

## 2015-10-23 NOTE — Progress Notes (Signed)
Patient ID: Becky Hall, female   DOB: 1961/05/05, 54 y.o.   MRN: [id number]   10/23/2015 at 12:52 PM  Becky OreWanda Kay Hall / DOB: 1961/05/05 / MRN: [id number]  Problem list reviewed and updated by me where necessary.   SUBJECTIVE  Becky OreWanda Kay Mcnamee is a 54 y.o. well appearing female presenting for the chief complaint of need immunizations.and PPD. She is UTD on immunizations..     She  has a past medical history of Allergy and Arthritis.    Medications reviewed and updated by myself where necessary, and exist elsewhere in the encounter.   Ms. Becky Hall is allergic to penicillins. She  reports that she has never smoked. She does not have any smokeless tobacco history on file. She reports that she does not drink alcohol or use illicit drugs. She  has no sexual activity history on file. The patient  has no past surgical history on file.  Her family history includes Cancer in her mother; Diabetes in her father; Hyperlipidemia in her father.  Review of Systems  Constitutional: Negative for fever.  Respiratory: Negative for shortness of breath.   Cardiovascular: Negative for chest pain.  Gastrointestinal: Negative for nausea.  Skin: Negative for rash.  Neurological: Negative for dizziness and headaches.    OBJECTIVE  Her  height is 5\' 6"  (1.676 m) and weight is 227 lb (102.967 kg). Her oral temperature is 98.1 F (36.7 C). Her blood pressure is 108/72 and her pulse is 101. Her respiration is 18 and oxygen saturation is 98%.  The patient's body mass index is 36.66 kg/(m^2).  Physical Exam  Constitutional: She is oriented to person, place, and time. She appears well-developed and well-nourished.  HENT:  Head: Normocephalic.  Eyes: Conjunctivae are normal. No scleral icterus.  Neck: Normal range of motion.  Cardiovascular: Normal rate.   Respiratory: Effort normal.  GI: She exhibits no distension.  Musculoskeletal: Normal range of motion.  Neurological: She is alert and oriented  to person, place, and time. Coordination normal.  Skin: Skin is warm and dry.  Psychiatric: She has a normal mood and affect.    No results found for this or any previous visit (from the past 24 hour(s)).  ASSESSMENT & PLAN  Becky Hall was seen today for immunizations.  Diagnoses and all orders for this visit:  Encounter for PPD test -     TB Skin Test  Counseling on health promotion and disease prevention -     TB Skin Test Read PPD skin test 48-72 hrs.

## 2015-10-23 NOTE — Progress Notes (Signed)

## 2015-10-24 NOTE — Progress Notes (Signed)
Subjective:     Patient ID: Becky Hall, female   DOB: July 21, 1961, 54 y.o.   MRN: [id number]  HPI patient states I'm getting quite a bit of pain in the bottom of my right heel and also I'm getting pain in my left midfoot region and outside of my left foot. The pain has never gone away completely   Review of Systems     Objective:   Physical Exam Neurovascular status intact muscle strength adequate with discomfort to palpation of the right plantar heel insertional tendon into the bone with moderate forefoot discomfort dorsal left with mild edema    Assessment:     Acute plantar fasciitis right with moderate depression of the arch and tendinitis left which is probably compensatory    Plan:     H&P conditions reviewed and careful injection plantar right administered 3 mg Kenalog 5 mg Xylocaine and advised on ice therapy for the dorsum left foot. Patient did have a night splint dispensed with instructions on usage and I went ahead today and I instructed on physical therapy and long-term orthotics and patient's scanned for custom Berkley type orthotics to reduce stress against the heel and arch

## 2015-10-25 ENCOUNTER — Ambulatory Visit (INDEPENDENT_AMBULATORY_CARE_PROVIDER_SITE_OTHER): Payer: BLUE CROSS/BLUE SHIELD | Admitting: *Deleted

## 2015-10-25 ENCOUNTER — Ambulatory Visit: Payer: Self-pay | Admitting: Podiatry

## 2015-10-25 DIAGNOSIS — Z111 Encounter for screening for respiratory tuberculosis: Secondary | ICD-10-CM

## 2015-10-25 LAB — TB SKIN TEST: TB SKIN TEST: NEGATIVE

## 2016-01-12 ENCOUNTER — Other Ambulatory Visit: Payer: Self-pay | Admitting: Orthopedic Surgery

## 2016-01-12 DIAGNOSIS — M542 Cervicalgia: Secondary | ICD-10-CM

## 2016-01-12 DIAGNOSIS — M25511 Pain in right shoulder: Secondary | ICD-10-CM

## 2016-01-30 ENCOUNTER — Telehealth: Payer: Self-pay

## 2016-01-30 NOTE — Telephone Encounter (Signed)
Timothy Lassoracy salisbury researched and found no record of [facility] being [facility].  Patient needs to complete eligibility before she can been seen here.  Patient may have us confused with [facility].   Lm @ [phone] advising patient.

## 2016-01-30 NOTE — Telephone Encounter (Signed)
Called spoke to mrs, she has sinus infection and needs to be seen. Mrs states she was seen about a year or two ago. Told mrs will will research this and call her back.

## 2016-01-30 NOTE — Telephone Encounter (Signed)
Received voice mail from patient requesting a call back , did not leave reason.  [phone]

## 2016-02-02 ENCOUNTER — Other Ambulatory Visit: Payer: No Typology Code available for payment source

## 2016-02-02 ENCOUNTER — Inpatient Hospital Stay: Admission: RE | Admit: 2016-02-02 | Payer: No Typology Code available for payment source | Source: Ambulatory Visit

## 2016-02-06 ENCOUNTER — Ambulatory Visit (INDEPENDENT_AMBULATORY_CARE_PROVIDER_SITE_OTHER): Payer: BLUE CROSS/BLUE SHIELD | Admitting: Family Medicine

## 2016-02-06 VITALS — BP 118/76 | HR 93 | Temp 98.5°F | Resp 18 | Wt 232.2 lb

## 2016-02-06 DIAGNOSIS — Z131 Encounter for screening for diabetes mellitus: Secondary | ICD-10-CM | POA: Diagnosis not present

## 2016-02-06 DIAGNOSIS — Z0000 Encounter for general adult medical examination without abnormal findings: Secondary | ICD-10-CM

## 2016-02-06 DIAGNOSIS — Z13220 Encounter for screening for lipoid disorders: Secondary | ICD-10-CM

## 2016-02-06 DIAGNOSIS — Z1159 Encounter for screening for other viral diseases: Secondary | ICD-10-CM | POA: Diagnosis not present

## 2016-02-06 DIAGNOSIS — Z114 Encounter for screening for human immunodeficiency virus [HIV]: Secondary | ICD-10-CM

## 2016-02-06 LAB — POCT GLYCOSYLATED HEMOGLOBIN (HGB A1C): Hemoglobin A1C: 5.9

## 2016-02-06 NOTE — Patient Instructions (Signed)
     IF you received an x-ray today, you will receive an invoice from [facility]. Please contact [facility] at [phone] with questions or concerns regarding your invoice.   IF you received labwork today, you will receive an invoice from [facility]. Please contact [facility] at [phone] with questions or concerns regarding your invoice.   Our billing staff will not be able to assist you with questions regarding bills from these companies.  You will be contacted with the lab results as soon as they are available. The fastest way to get your results is to activate your My Chart account. Instructions are located on the last page of this paperwork. If you have not heard from us regarding the results in 2 weeks, please contact this office.      

## 2016-02-06 NOTE — Progress Notes (Signed)
Subjective:     Becky Hall is a 55 y.o. female and is here for a comprehensive physical exam. The patient reports no problems.  UTD with immunizations (TDAP today), PAP smear one year ago (no atypical cells), [facility] 55 [facility] verified through careeverywhere).  Mammogram not done to date and denies FHx breast CA/uterine CA  Social History   Social History  . Marital Status: Single    Spouse Name: N/A  . Number of Children: N/A  . Years of Education: N/A   Occupational History  . Not on file.   Social History Main Topics  . Smoking status: Never Smoker   . Smokeless tobacco: Not on file  . Alcohol Use: No  . Drug Use: No  . Sexual Activity: Not on file   Other Topics Concern  . Not on file   Social History Narrative   Health Maintenance  Topic Date Due  . Hepatitis C Screening  1961-11-22  . HIV Screening  03/21/1974  . TETANUS/TDAP  03/21/1978  . PAP SMEAR  03/21/1980  . MAMMOGRAM  03/21/2009  . COLONOSCOPY  03/21/2009  . INFLUENZA VACCINE  06/26/2015    The following portions of the patient's history were reviewed and updated as appropriate: allergies, current medications, past family history, past medical history, past social history, past surgical history and problem list.  Review of Systems Pertinent items are noted in HPI.   Objective:    BP 118/76 mmHg  Pulse 93  Temp(Src) 98.5 F (36.9 C) (Oral)  Resp 18  Wt 232 lb 3.2 oz (105.325 kg)  SpO2 97% General appearance: alert, cooperative and appears stated age Head: Normocephalic, without obvious abnormality, atraumatic Neck: no adenopathy Lungs: clear to auscultation bilaterally Heart: regular rate and rhythm, S1, S2 normal, no murmur, click, rub or gallop Abdomen: soft, non-tender; bowel sounds normal; no masses,  no organomegaly    Assessment:    Healthy female exam.      Plan:  Recommend Mammogram.  Otherwise UTD on screening.      See After Visit Summary for  Counseling Recommendations

## 2016-02-07 LAB — LIPID PANEL
Cholesterol: 130 mg/dL (ref 125–200)
HDL: 45 mg/dL — ABNORMAL LOW (ref >=46)
LDL Cholesterol: 69 mg/dL (ref <130)
TRIGLYCERIDES: 80 mg/dL (ref <150)
Total CHOL/HDL Ratio: 2.9 Ratio (ref <=5.0)
VLDL: 16 mg/dL (ref <30)

## 2016-02-07 LAB — HIV ANTIBODY (ROUTINE TESTING W REFLEX): HIV 1&2 Ab, 4th Generation: NONREACTIVE

## 2016-02-08 LAB — HEPATITIS C ANTIBODY: HCV Ab: NEGATIVE

## 2016-02-19 ENCOUNTER — Ambulatory Visit
Admission: RE | Admit: 2016-02-19 | Discharge: 2016-02-19 | Disposition: A | Discharge status: Home / Self Care | Payer: BLUE CROSS/BLUE SHIELD | Source: Ambulatory Visit | Attending: Orthopedic Surgery | Admitting: Orthopedic Surgery

## 2016-02-19 DIAGNOSIS — M25511 Pain in right shoulder: Secondary | ICD-10-CM

## 2016-02-19 DIAGNOSIS — M542 Cervicalgia: Secondary | ICD-10-CM

## 2016-02-19 MED ORDER — IOHEXOL 180 MG/ML  SOLN: 15.0000 mL | Freq: Once | INTRAMUSCULAR | Status: DC | PRN

## 2016-02-25 ENCOUNTER — Other Ambulatory Visit: Payer: BLUE CROSS/BLUE SHIELD

## 2016-03-07 ENCOUNTER — Telehealth: Payer: Self-pay | Admitting: Nurse Practitioner

## 2016-03-07 NOTE — Telephone Encounter (Signed)
Would like to sched apt.

## 2016-03-07 NOTE — Telephone Encounter (Signed)
Caller was not a pt here. Eligibiltiy explained and app mailed.

## 2017-02-18 IMAGING — MR MR SHOULDER*R* W/CM
5 series · 40 of 40 positions shown · IV contrast (agent unspecified)
Comparison: None.

CLINICAL DATA: Right shoulder pain and limited range of motion for
3 months.

EXAM:
MR ARTHROGRAM OF THE RIGHT SHOULDER
TECHNIQUE: Multiplanar, multisequence MR imaging of the right shoulder was
performed following the administration of intra-articular contrast.
CONTRAST:  See Injection Documentation.

[Series 8: T1 fat-sat · axial · 4.0mm · 0.23mm/px · z∈[-26,+52]mm · 8 of 18 slices shown (1 of 3)]
[im 1/18]
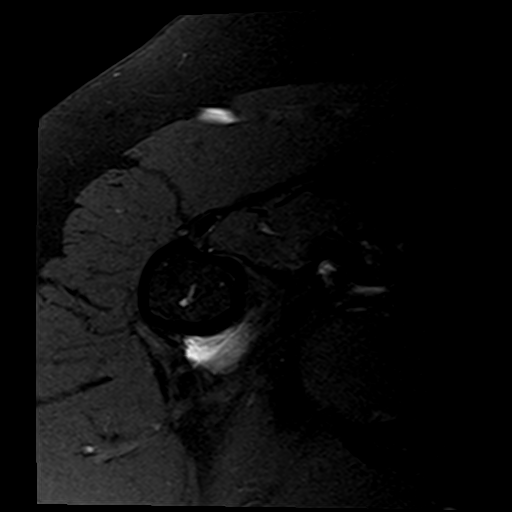
[im 3/18]
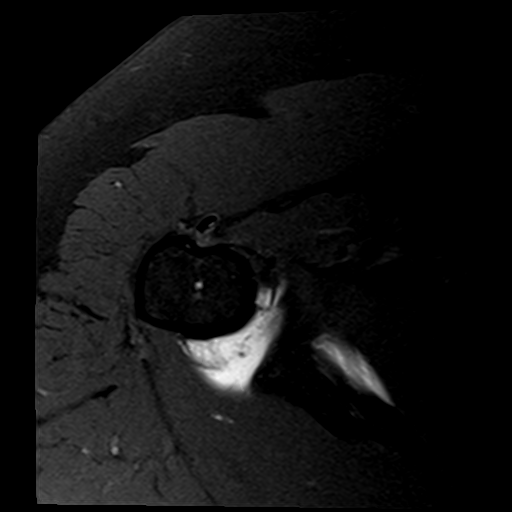
[im 5/18]
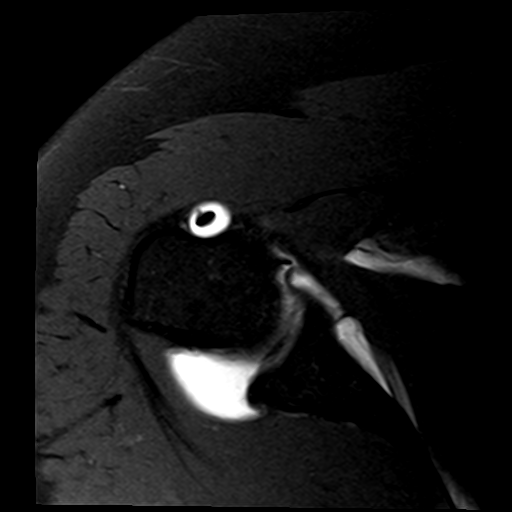
[im 8/18]
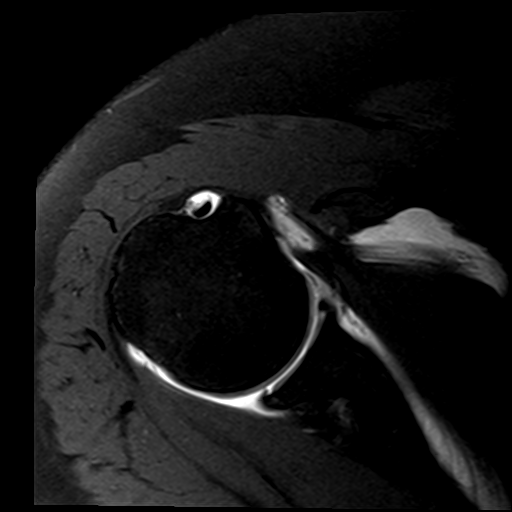
[im 10/18]
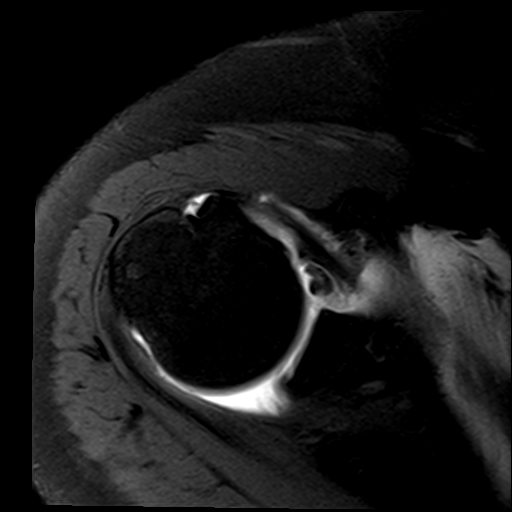
[im 13/18]
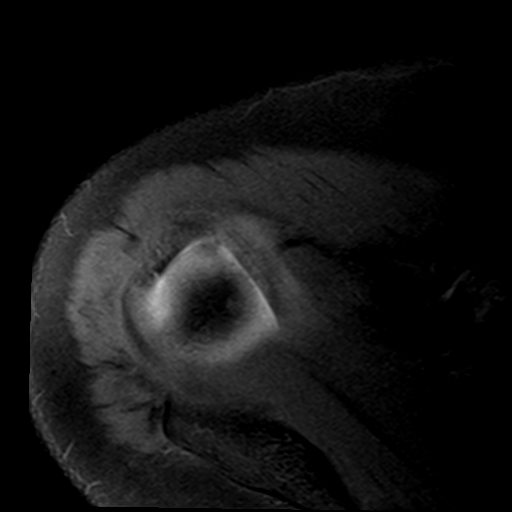
[im 15/18]
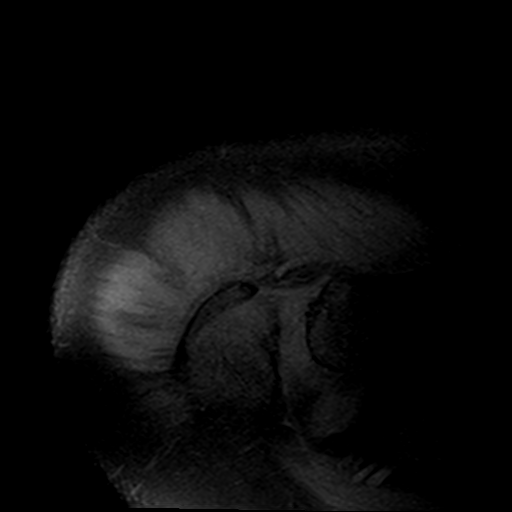
[im 18/18]
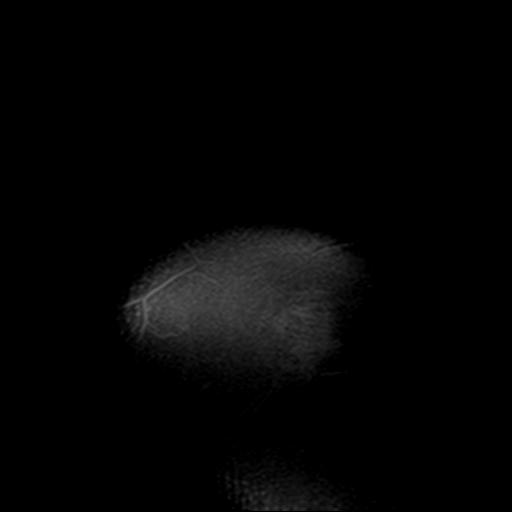

[Series 9: T2 fat-sat · oblique · 4.0mm · 0.55mm/px · 8 of 17 slices shown (1 of 2)]
[im 1/17]
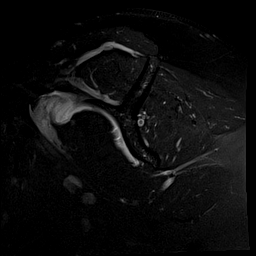
[im 3/17]
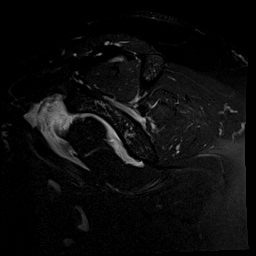
[im 5/17]
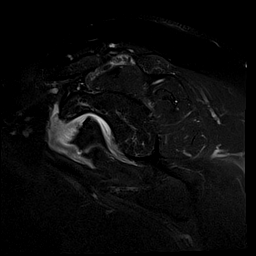
[im 7/17]
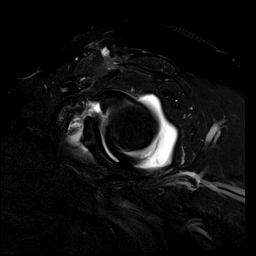
[im 10/17]
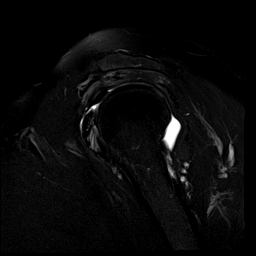
[im 12/17]
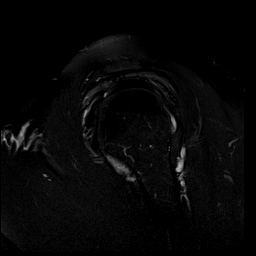
[im 14/17]
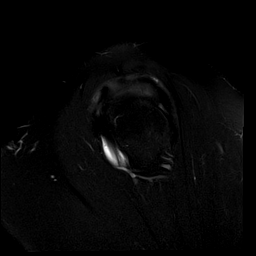
[im 17/17]
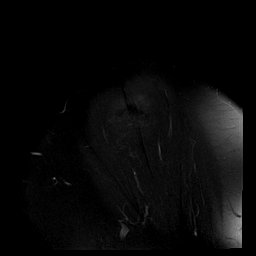

[Series 10: T1 fat-sat · oblique · 4.0mm · 0.44mm/px · 8 of 16 slices shown (2 of 3)]
[im 1/16]
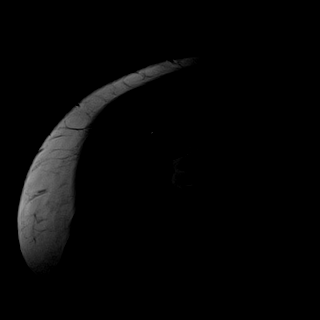
[im 3/16]
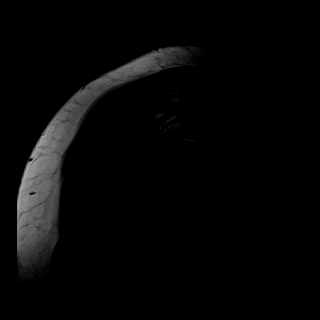
[im 5/16]
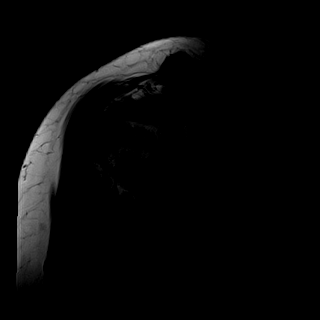
[im 7/16]
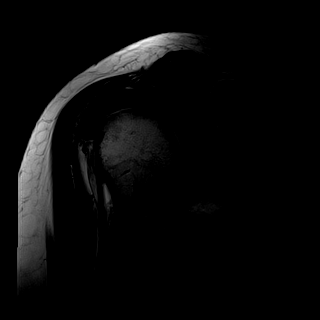
[im 9/16]
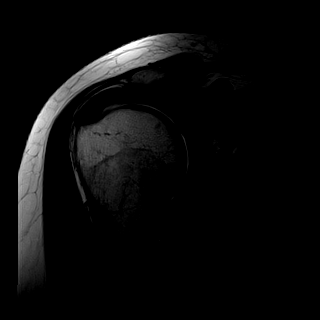
[im 11/16]
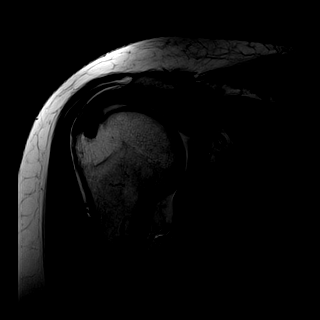
[im 13/16]
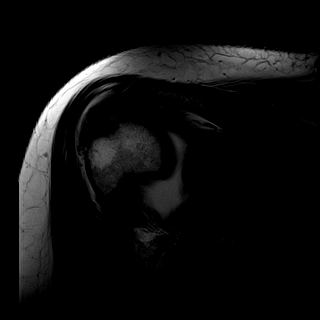
[im 16/16]
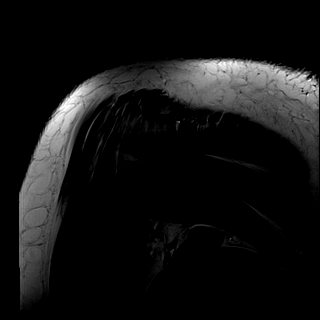

[Series 12: T1 fat-sat · oblique · 4.0mm · 0.55mm/px · 8 of 16 slices shown (3 of 3)]
[im 1/16]
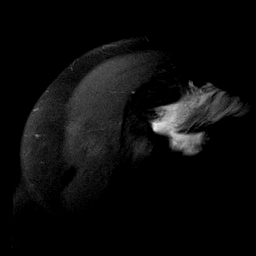
[im 3/16]
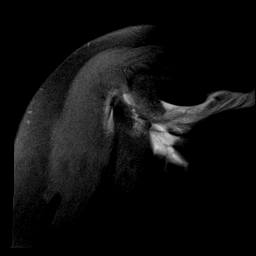
[im 5/16]
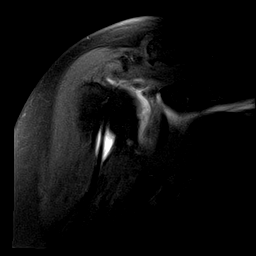
[im 7/16]
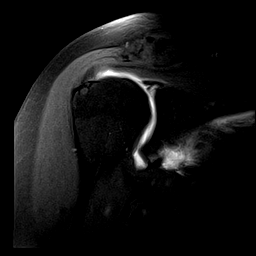
[im 9/16]
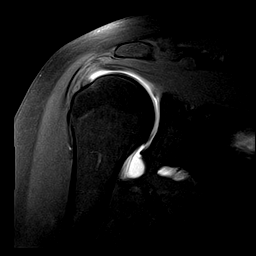
[im 11/16]
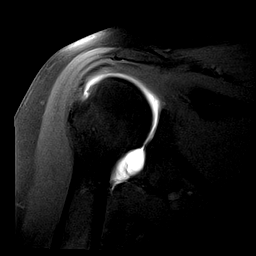
[im 13/16]
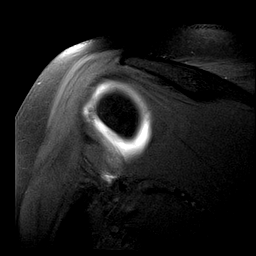
[im 16/16]
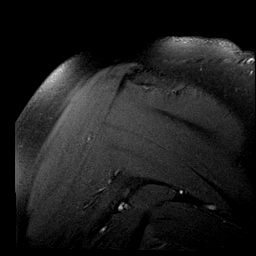

[Series 13: T2 fat-sat · oblique · 4.0mm · 0.55mm/px · 8 of 16 slices shown (2 of 2)]
[im 1/16]
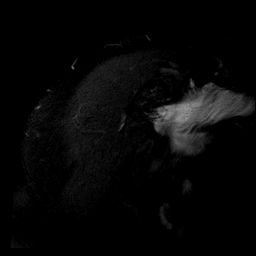
[im 3/16]
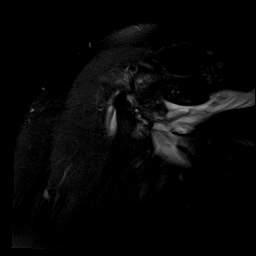
[im 5/16]
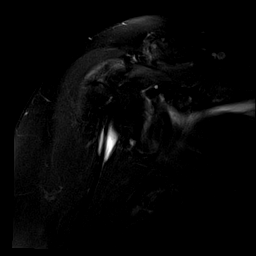
[im 7/16]
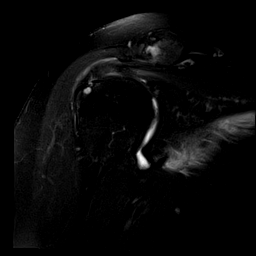
[im 9/16]
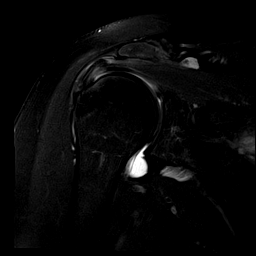
[im 11/16]
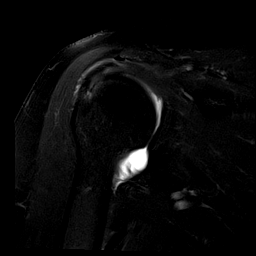
[im 13/16]
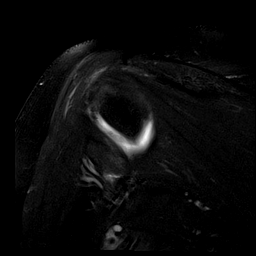
[im 16/16]
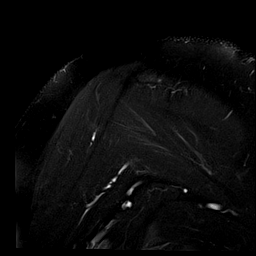

[40 of 40 positions shown; findings below may reference images not displayed]

FINDINGS: Rotator cuff: There is slight chronic hypertrophy and degeneration
of the distal infraspinatus and supraspinous tendons. Contrast is
seen in the distal subscapularis tendon which may be iatrogenic. The
appearance is not typical for a tear.

Muscles: Normal.

Biceps long head: Properly located and intact.

Acromioclavicular Joint: Moderate AC joint arthropathy. Type 2
acromion. Focal debris and a small amount of fluid in the
subacromial/subdeltoid bursae.

Glenohumeral Joint: Normal.

Labrum: SLAP tear at the level of the biceps tendon origin. There is
an anatomic variant of Klpigbb Moolman complex involving the anterior
portion of the labrum.

Bones: Small cystic degenerative changes in the greater tuberosity.
IMPRESSION: Focal SLAP tear at the origin of the long head of the biceps tendon
from the superior labrum.

Focal degenerative changes of the distal supraspinous and
infraspinatus tendons.

Slight inflammation of the subacromial/subdeltoid bursae.

## 2019-01-21 ENCOUNTER — Encounter (INDEPENDENT_AMBULATORY_CARE_PROVIDER_SITE_OTHER): Payer: BLUE CROSS/BLUE SHIELD

## 2019-02-02 ENCOUNTER — Encounter (INDEPENDENT_AMBULATORY_CARE_PROVIDER_SITE_OTHER): Payer: Self-pay

## 2019-02-02 ENCOUNTER — Ambulatory Visit (INDEPENDENT_AMBULATORY_CARE_PROVIDER_SITE_OTHER): Payer: Self-pay | Admitting: Bariatrics

## 2019-02-16 ENCOUNTER — Ambulatory Visit (INDEPENDENT_AMBULATORY_CARE_PROVIDER_SITE_OTHER): Payer: BLUE CROSS/BLUE SHIELD | Admitting: Bariatrics

## 2019-02-17 ENCOUNTER — Encounter (INDEPENDENT_AMBULATORY_CARE_PROVIDER_SITE_OTHER): Payer: Self-pay

## 2019-02-18 ENCOUNTER — Ambulatory Visit: Payer: BLUE CROSS/BLUE SHIELD | Admitting: Registered"

## 2019-04-01 ENCOUNTER — Ambulatory Visit: Payer: BLUE CROSS/BLUE SHIELD | Admitting: Registered"

## 2019-04-15 ENCOUNTER — Ambulatory Visit: Payer: BLUE CROSS/BLUE SHIELD | Admitting: Registered"

## 2019-05-19 ENCOUNTER — Ambulatory Visit: Payer: BLUE CROSS/BLUE SHIELD | Admitting: Registered"

## 2019-06-17 ENCOUNTER — Encounter: Payer: Self-pay | Admitting: Registered"

## 2019-06-17 ENCOUNTER — Other Ambulatory Visit: Payer: Self-pay

## 2019-06-17 ENCOUNTER — Encounter: Payer: BC Managed Care – PPO | Attending: Internal Medicine | Admitting: Registered"

## 2019-06-17 DIAGNOSIS — E669 Obesity, unspecified: Secondary | ICD-10-CM | POA: Diagnosis present

## 2019-06-17 NOTE — Progress Notes (Signed)
Medical Nutrition Therapy:  Appt start time: 1400 end time:  1500.   Assessment:  Primary concerns today: Would like to learn how to eat right, in the right proportions, improve health and lose weight if possible.   Patient works [employer], hours cut back to 4 hrs/day.  Patient states she needs guidance, meal plans something she can follow. States she lives alone, eats fast food, drinks sweetened coffee and enjoys sweet tea. Drinks a lot of ice water, sucks on ice, but doesn't chew ice. Mostly craves ice water in summer. Likely not a sign of iron deficiency.  Patient states the route she takes to work goes by a [place] right before she gets on the ramp to [address]. Patient's work has been reduced to 4 hrs/day d/t COVID.  Pt states she has friends who have had bariatric surgery and was wondering if she should do that too. Discussed need for lifestyle changes whether she has surgery or not. Will defer more discussion to other [facility] dietitians who specialize in bariatric counseling if she continues to have interest.  Patient states over last 5 yrs does not get much physical activity, just goes to work and goes home. Used to workout and walk, is motivated by being with other people. Patient states her doctor told her she needs to get more active.  Patient doesn't like to walk in neighborhood, afraid of dogs.   Patient states for awhile she used portion cups she bought from [place] did x1 month and felt like her clothes were fitting looser.  Pt states she had a meniscus tear L knee 12 yrs ago, but it hasn't bothered her until gained weight.  Pt states she does not want to take medications, mostly just takes vitamins/supplements.  Family history T2DM, RD did not see A1c in referral labs, did not discuss with patient.   MEDICATIONS: reviewed   DIETARY INTAKE:  24-hr recall:  B ( AM): bacon, egg, hasbrown sometimes, coffee w/ 4 french vanilla  Snk ( AM):  L ( PM):  [employer] chicken sand, sweet tea Snk ( PM):PB crackers D ( PM):  Snk ( PM): cookies Beverages: water, sweet tea, coffee  Usual physical activity: ADLs   Progress Towards Goal(s):  New goals.   Nutritional Diagnosis:  NI-5.8.2 Excessive carbohydrate intake As related to carb heavy meals when eating out.  As evidenced by dietary recall, driking large sweet tea and eating cookies.    Intervention:  Nutrition Education. Importance of not trying to make too many changes at one time. Discussed beverage choices.  Get with your friends to exercise. Choose water instead of sweet tea when getting meals at [place] or [place]. Consider getting [place] Yogurt for convenient breakfast Use the meal planner handouts to get back into the habit of preparing meals.  Teaching Method Utilized:  Visual Auditory Hands on  Handouts given during visit include:  MyPlate Planner  Meal planner handout from non-hunger eating class  Salad ideas / grain bowl ideas   Barriers to learning/adherence to lifestyle change: none  Demonstrated degree of understanding via:  Teach Back   Monitoring/Evaluation:  Dietary intake, exercise, and body weight in 1 month(s).

## 2019-06-17 NOTE — Patient Instructions (Addendum)
Get with your friends to exercise. Choose water instead of sweet tea when getting meals at [place] or [place]. Consider getting [place] Yogurt for convenient breakfast Use the meal planner handouts to get back into the habit of preparing meals.

## 2019-07-22 ENCOUNTER — Ambulatory Visit: Payer: BLUE CROSS/BLUE SHIELD | Admitting: Registered"

## 2019-10-15 ENCOUNTER — Other Ambulatory Visit: Payer: Self-pay | Admitting: Internal Medicine

## 2019-10-15 DIAGNOSIS — E0590 Thyrotoxicosis, unspecified without thyrotoxic crisis or storm: Secondary | ICD-10-CM

## 2019-10-27 ENCOUNTER — Other Ambulatory Visit: Payer: BLUE CROSS/BLUE SHIELD

## 2019-11-03 ENCOUNTER — Ambulatory Visit
Admission: RE | Admit: 2019-11-03 | Discharge: 2019-11-03 | Disposition: A | Discharge status: Home / Self Care | Payer: BC Managed Care – PPO | Source: Ambulatory Visit | Attending: Internal Medicine | Admitting: Internal Medicine

## 2019-11-03 DIAGNOSIS — E0590 Thyrotoxicosis, unspecified without thyrotoxic crisis or storm: Secondary | ICD-10-CM

## 2019-11-04 ENCOUNTER — Ambulatory Visit: Payer: BLUE CROSS/BLUE SHIELD | Admitting: Podiatry

## 2019-11-24 ENCOUNTER — Ambulatory Visit: Payer: BLUE CROSS/BLUE SHIELD | Admitting: Podiatry

## 2019-12-02 ENCOUNTER — Ambulatory Visit: Payer: BLUE CROSS/BLUE SHIELD | Admitting: Podiatry

## 2019-12-10 ENCOUNTER — Ambulatory Visit: Payer: BLUE CROSS/BLUE SHIELD | Admitting: Podiatry

## 2019-12-10 ENCOUNTER — Other Ambulatory Visit: Payer: Self-pay

## 2019-12-10 ENCOUNTER — Ambulatory Visit (INDEPENDENT_AMBULATORY_CARE_PROVIDER_SITE_OTHER): Payer: BC Managed Care – PPO

## 2019-12-10 ENCOUNTER — Encounter: Payer: Self-pay | Admitting: Podiatry

## 2019-12-10 DIAGNOSIS — M7752 Other enthesopathy of left foot: Secondary | ICD-10-CM | POA: Diagnosis not present

## 2019-12-10 DIAGNOSIS — M779 Enthesopathy, unspecified: Secondary | ICD-10-CM

## 2019-12-10 DIAGNOSIS — M2140 Flat foot [pes planus] (acquired), unspecified foot: Secondary | ICD-10-CM

## 2019-12-10 DIAGNOSIS — M7751 Other enthesopathy of right foot: Secondary | ICD-10-CM | POA: Diagnosis not present

## 2019-12-10 DIAGNOSIS — M2142 Flat foot [pes planus] (acquired), left foot: Secondary | ICD-10-CM

## 2019-12-10 DIAGNOSIS — M79672 Pain in left foot: Secondary | ICD-10-CM

## 2019-12-10 NOTE — Progress Notes (Signed)
Subjective:   Patient ID: Becky Hall, female   DOB: 59 y.o.   MRN: [id number]   HPI Patient presents stating that she is got significant flattening of her arch bilateral and she does have obesity is complicating factor and has knee problems and would like orthotics to try to help her.  States she has trouble being on her feet and she does not smoke and likes to be active   Review of Systems  All other systems reviewed and are negative.       Objective:  Physical Exam Vitals and nursing note reviewed.  Constitutional:      Appearance: She is well-developed.  Pulmonary:     Effort: Pulmonary effort is normal.  Musculoskeletal:        General: Normal range of motion.  Skin:    General: Skin is warm.  Neurological:     Mental Status: She is alert.     Neurovascular status intact muscle strength found to be adequate range of motion within normal limits.  Patient is found to have significant flattening of the arch bilateral with forefoot prominence and irritation of tissue and inflammation of the plantar capsule at different times.  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  Patient is casted for functional orthotics and will be seen back when returned.  She was evaluated by ped orthotist  X-rays indicate significant depression of the arch bilateral

## 2020-01-03 ENCOUNTER — Ambulatory Visit: Payer: BC Managed Care – PPO | Admitting: Orthotics

## 2020-01-03 DIAGNOSIS — M79672 Pain in left foot: Secondary | ICD-10-CM

## 2020-01-03 NOTE — Progress Notes (Signed)
Patient came in today to pick up custom made foot orthotics.  The goals were accomplished and the patient reported no dissatisfaction with said orthotics.  Patient was advised of breakin period and how to report any issues. 

## 2020-01-06 ENCOUNTER — Other Ambulatory Visit: Payer: Self-pay | Admitting: Podiatry

## 2020-01-06 DIAGNOSIS — M2140 Flat foot [pes planus] (acquired), unspecified foot: Secondary | ICD-10-CM

## 2020-03-24 ENCOUNTER — Other Ambulatory Visit: Payer: Self-pay | Admitting: Orthopedic Surgery

## 2020-03-24 DIAGNOSIS — M545 Low back pain, unspecified: Secondary | ICD-10-CM

## 2020-04-07 ENCOUNTER — Ambulatory Visit
Admission: RE | Admit: 2020-04-07 | Discharge: 2020-04-07 | Disposition: A | Discharge status: Home / Self Care | Payer: BC Managed Care – PPO | Source: Ambulatory Visit | Attending: Orthopedic Surgery | Admitting: Orthopedic Surgery

## 2020-04-07 ENCOUNTER — Other Ambulatory Visit: Payer: Self-pay

## 2020-04-07 DIAGNOSIS — M545 Low back pain, unspecified: Secondary | ICD-10-CM

## 2021-05-31 ENCOUNTER — Other Ambulatory Visit: Payer: Self-pay | Admitting: Internal Medicine

## 2021-05-31 DIAGNOSIS — E0590 Thyrotoxicosis, unspecified without thyrotoxic crisis or storm: Secondary | ICD-10-CM

## 2021-05-31 DIAGNOSIS — E042 Nontoxic multinodular goiter: Secondary | ICD-10-CM

## 2021-06-11 ENCOUNTER — Ambulatory Visit
Admission: RE | Admit: 2021-06-11 | Discharge: 2021-06-11 | Disposition: A | Discharge status: Home / Self Care | Payer: 59 | Source: Ambulatory Visit | Attending: Internal Medicine | Admitting: Internal Medicine

## 2021-06-11 DIAGNOSIS — E0590 Thyrotoxicosis, unspecified without thyrotoxic crisis or storm: Secondary | ICD-10-CM

## 2021-06-11 DIAGNOSIS — E042 Nontoxic multinodular goiter: Secondary | ICD-10-CM

## 2021-09-27 ENCOUNTER — Other Ambulatory Visit: Payer: Self-pay | Admitting: Family Medicine

## 2021-09-27 ENCOUNTER — Other Ambulatory Visit: Payer: Self-pay | Admitting: Internal Medicine

## 2021-09-27 ENCOUNTER — Other Ambulatory Visit: Payer: Self-pay | Admitting: Plastic Surgery

## 2021-09-27 DIAGNOSIS — E0520 Thyrotoxicosis with toxic multinodular goiter without thyrotoxic crisis or storm: Secondary | ICD-10-CM

## 2021-09-27 DIAGNOSIS — R599 Enlarged lymph nodes, unspecified: Secondary | ICD-10-CM

## 2021-10-03 ENCOUNTER — Other Ambulatory Visit: Payer: Self-pay

## 2021-10-03 ENCOUNTER — Ambulatory Visit (INDEPENDENT_AMBULATORY_CARE_PROVIDER_SITE_OTHER): Payer: 59 | Admitting: Podiatry

## 2021-10-03 ENCOUNTER — Ambulatory Visit (INDEPENDENT_AMBULATORY_CARE_PROVIDER_SITE_OTHER): Payer: 59

## 2021-10-03 ENCOUNTER — Encounter: Payer: Self-pay | Admitting: Podiatry

## 2021-10-03 ENCOUNTER — Other Ambulatory Visit: Payer: 59

## 2021-10-03 DIAGNOSIS — L309 Dermatitis, unspecified: Secondary | ICD-10-CM

## 2021-10-03 DIAGNOSIS — M79675 Pain in left toe(s): Secondary | ICD-10-CM | POA: Diagnosis not present

## 2021-10-03 DIAGNOSIS — M779 Enthesopathy, unspecified: Secondary | ICD-10-CM

## 2021-10-03 NOTE — Progress Notes (Signed)
Subjective:   Patient ID: Becky Hall, female   DOB: 60 y.o.   MRN: [id number]   HPI Patient presents stating he that she has irritation between the fourth and fifth digits on her left foot some discoloration of her nail and is concerned about flatfoot deformity.     ROS      Objective:  Physical Exam  Neurovascular status found to be intact muscle strength is adequate irritation between the fourth and fifth digits on the left foot localized no breakdown in skin or other bony pathology with mild flatfoot deformity to moderate with inflammation of the medial ligaments     Assessment:  Probability for low-grade fungal infection with no break in skin fourth interspace left along with moderate to severe flatfoot deformity     Plan:  H&P x-ray left reviewed advised on supportive therapy anti-inflammatories and I we will start Lamisil cream between the fourth and fifth digits with possibility for oral medication depending on response.  Reappoint to recheck  X-rays indicate there is significant flatfoot deformity moderate arthritis of the midtarsal subtalar joint no other indications pathology moderate compression fourth and fifth digit

## 2021-10-04 ENCOUNTER — Other Ambulatory Visit: Payer: Self-pay | Admitting: Podiatry

## 2021-10-04 DIAGNOSIS — M779 Enthesopathy, unspecified: Secondary | ICD-10-CM

## 2021-10-16 ENCOUNTER — Inpatient Hospital Stay: Admission: RE | Admit: 2021-10-16 | Payer: 59 | Source: Ambulatory Visit

## 2021-10-26 ENCOUNTER — Other Ambulatory Visit: Payer: 59

## 2022-01-03 ENCOUNTER — Other Ambulatory Visit: Payer: Self-pay | Admitting: Internal Medicine

## 2022-01-03 DIAGNOSIS — E042 Nontoxic multinodular goiter: Secondary | ICD-10-CM

## 2022-02-06 ENCOUNTER — Other Ambulatory Visit: Payer: Self-pay

## 2022-02-14 ENCOUNTER — Other Ambulatory Visit: Payer: Self-pay

## 2022-02-15 ENCOUNTER — Other Ambulatory Visit: Payer: Self-pay

## 2022-03-18 ENCOUNTER — Inpatient Hospital Stay: Admission: RE | Admit: 2022-03-18 | Payer: BC Managed Care – PPO | Source: Ambulatory Visit

## 2022-06-30 ENCOUNTER — Other Ambulatory Visit: Payer: Self-pay

## 2022-06-30 ENCOUNTER — Emergency Department
Admission: EM | Admit: 2022-06-30 | Discharge: 2022-06-30 | Disposition: A | Discharge status: Home / Self Care | Payer: BC Managed Care – PPO | Attending: Emergency Medicine | Admitting: Emergency Medicine

## 2022-06-30 ENCOUNTER — Emergency Department: Payer: BC Managed Care – PPO

## 2022-06-30 DIAGNOSIS — Y9301 Activity, walking, marching and hiking: Secondary | ICD-10-CM | POA: Diagnosis not present

## 2022-06-30 DIAGNOSIS — S92532A Displaced fracture of distal phalanx of left lesser toe(s), initial encounter for closed fracture: Secondary | ICD-10-CM | POA: Insufficient documentation

## 2022-06-30 DIAGNOSIS — T148XXA Other injury of unspecified body region, initial encounter: Secondary | ICD-10-CM

## 2022-06-30 DIAGNOSIS — W231XXA Caught, crushed, jammed, or pinched between stationary objects, initial encounter: Secondary | ICD-10-CM | POA: Insufficient documentation

## 2022-06-30 DIAGNOSIS — S99922A Unspecified injury of left foot, initial encounter: Secondary | ICD-10-CM | POA: Diagnosis present

## 2022-06-30 NOTE — ED Triage Notes (Signed)
Patient reports left second toe pain. States she was walking on carpet and toe got caught. Patient with purple discoloration to end of left second toe. Ambulatory. AOX4. Resp even, unlabored on RA.

## 2022-06-30 NOTE — Discharge Instructions (Signed)
You have a small avulsion fracture off the top of your bone.  It is not broken completely through.  You may wear the cast shoe to provide some extra support.  Please continue to rest, ice, elevate your foot.  You may follow-up with orthopedics if your pain persists.  Please return for any new, worsening, or change in symptoms or other concerns.  It was a pleasure caring for you today.

## 2022-06-30 NOTE — ED Provider Notes (Signed)
[facility] Provider Note    Event Date/Time   First MD Initiated Contact with Patient 06/30/22 2052     (approximate)   History   Toe Pain   HPI  Becky Hall is a 61 y.o. female who presents today for evaluation of left second toe injury.  Patient reports that she tripped on carpeting on her floor.  She denies fall to the ground, head strike or LOC. No other injury sustained.  There are no problems to display for this patient.         Physical Exam   Triage Vital Signs: ED Triage Vitals [06/30/22 2044]  Enc Vitals Group     BP (!) 130/94     Pulse Rate (!) 102     Resp 20     Temp 98.1 F (36.7 C)     Temp Source Oral     SpO2 97 %     Weight 220 lb (99.8 kg)     Height 5\' 6"  (1.676 m)     Head Circumference      Peak Flow      Pain Score 8     Pain Loc      Pain Edu?      Excl. in GC?     Most recent vital signs: Vitals:   06/30/22 2044  BP: (!) 130/94  Pulse: (!) 102  Resp: 20  Temp: 98.1 F (36.7 C)  SpO2: 97%    Physical Exam Vitals and nursing note reviewed.  Constitutional:      General: Awake and alert. No acute distress.    Appearance: Normal appearance. The patient is normal weight.  HENT:     Head: Normocephalic and atraumatic.     Mouth: Mucous membranes are moist.  Eyes:     General: PERRL. Normal EOMs        Right eye: No discharge.        Left eye: No discharge.     Conjunctiva/sclera: Conjunctivae normal.  Cardiovascular:     Rate and Rhythm: Normal rate and regular rhythm.     Pulses: Normal pulses.  Pulmonary:     Effort: Pulmonary effort is normal. No respiratory distress.  Abdominal:     Abdomen is soft. There is no abdominal tenderness. Musculoskeletal:        General: No swelling. Normal range of motion.     Cervical back: Normal range of motion and neck supple.  Left foot: 2nd toe with 1x0.5cm area of ecchymosis just proximal to the nail base. No nailbed disruption. No subungual  hematoma. Normal pedal pulses. No nail loosening. No open wounds.No pain elsewhere throughout foot/ankle Skin:    General: Skin is warm and dry.     Capillary Refill: Capillary refill takes less than 2 seconds.     Findings: No rash.  Neurological:     Mental Status: The patient is awake and alert.      ED Results / Procedures / Treatments   Labs (all labs ordered are listed, but only abnormal results are displayed) Labs Reviewed - No data to display   EKG     RADIOLOGY     PROCEDURES:  Critical Care performed:   Procedures   MEDICATIONS ORDERED IN ED: Medications - No data to display   IMPRESSION / MDM / ASSESSMENT AND PLAN / ED COURSE  I reviewed the triage vital signs and the nursing notes.   Differential diagnosis includes, but is not limited to,  fracture, contusion, dislocation.  No nailbed disruption or open wound.  No subungual hematoma.  X-ray was obtained and demonstrates a small avulsion fracture off of the distal phalanx.  This is the location of her ecchymosis.  She is neurovascularly intact.  She was in a postop shoe, and instructed to rest, ice, elevate the area.  She was given the information for orthopedics if her pain persists.  Patient understands and agrees with plan.  She was discharged with her family member.   Patient's presentation is most consistent with acute complicated illness / injury requiring diagnostic workup.    FINAL CLINICAL IMPRESSION(S) / ED DIAGNOSES   Final diagnoses:  Avulsion fracture     Rx / DC Orders   ED Discharge Orders     None        Note:  This document was prepared using Dragon voice recognition software and may include unintentional dictation errors.   Jackelyn Hoehn, PA-C 06/30/22 2200    Sharyn Creamer, MD 07/02/22 Jacinta Shoe

## 2022-08-16 ENCOUNTER — Other Ambulatory Visit: Payer: Self-pay | Admitting: Internal Medicine

## 2022-08-16 DIAGNOSIS — Z1231 Encounter for screening mammogram for malignant neoplasm of breast: Secondary | ICD-10-CM

## 2022-09-02 ENCOUNTER — Ambulatory Visit: Payer: BC Managed Care – PPO

## 2023-02-05 ENCOUNTER — Other Ambulatory Visit (HOSPITAL_COMMUNITY): Payer: Self-pay | Admitting: Internal Medicine

## 2023-02-05 DIAGNOSIS — E042 Nontoxic multinodular goiter: Secondary | ICD-10-CM

## 2023-04-02 ENCOUNTER — Ambulatory Visit (INDEPENDENT_AMBULATORY_CARE_PROVIDER_SITE_OTHER): Payer: BC Managed Care – PPO | Admitting: Internal Medicine

## 2023-04-02 ENCOUNTER — Encounter (INDEPENDENT_AMBULATORY_CARE_PROVIDER_SITE_OTHER): Payer: Self-pay | Admitting: Internal Medicine

## 2023-04-02 VITALS — BP 131/86 | HR 100 | Temp 97.7°F | Ht 64.0 in | Wt 219.0 lb

## 2023-04-02 DIAGNOSIS — R7303 Prediabetes: Secondary | ICD-10-CM | POA: Insufficient documentation

## 2023-04-02 DIAGNOSIS — I10 Essential (primary) hypertension: Secondary | ICD-10-CM | POA: Diagnosis not present

## 2023-04-02 DIAGNOSIS — M170 Bilateral primary osteoarthritis of knee: Secondary | ICD-10-CM

## 2023-04-02 DIAGNOSIS — Z6837 Body mass index (BMI) 37.0-37.9, adult: Secondary | ICD-10-CM

## 2023-04-02 DIAGNOSIS — E668 Other obesity: Secondary | ICD-10-CM | POA: Diagnosis not present

## 2023-04-02 DIAGNOSIS — Z0289 Encounter for other administrative examinations: Secondary | ICD-10-CM

## 2023-04-02 NOTE — Assessment & Plan Note (Signed)
Most recent hemoglobin A1c on file was 5.9 in 2017.  I checked in care everywhere and could not find additional test results.  We will be checking a fasting blood glucose, insulin levels and hemoglobin A1c with intake labs.  Patient also counseled on disease state and risk of progression.  Losing 10% of body weight may improve condition.

## 2023-04-02 NOTE — Assessment & Plan Note (Signed)
Patient is experiencing significant discomfort and impaired mobility due to osteoarthritis of her knees and biomechanical forces of her weight.  Losing 15% of her body weight may reduce discomfort and improve quality of life.  She is not interested at this time in joint replacement surgery.  This affects her ability to engage in gainful physical activity.

## 2023-04-02 NOTE — Assessment & Plan Note (Signed)
We reviewed weight, biometrics, associated medical conditions and contributing factors with patient. She would benefit from weight loss therapy via a modified calorie, low-carb, high-protein nutritional plan tailored to their REE (resting energy expenditure) which will be determined by indirect calorimetry.  We will also assess for cardiometabolic risk and nutritional derangements via fasting serologies at her next appointment. 

## 2023-04-02 NOTE — Assessment & Plan Note (Signed)
Blood pressure slightly above target.  She is currently on low-dose hydrochlorothiazide but does not take medication regularly.  She does not like to take medications.  She has not been monitoring at home.  We will check renal parameters with intake labs and will advise patient on disease specific goals in an attempt to reduce the risk of complications and cardiovascular risk.

## 2023-04-02 NOTE — Progress Notes (Signed)
Office: [phone]  /  Fax: [phone]   Initial Visit  Becky Hall was seen in clinic today to evaluate for obesity. She is interested in losing weight to improve overall health and reduce the risk of weight related complications. She presents today to review program treatment options, initial physical assessment, and evaluation.     She was referred by: Friend or Family  When asked what else they would like to accomplish? She states: Improve existing medical conditions  Weight history: has gained weight over the years.   When asked how has your weight affected you? She states: Contributed to orthopedic problems or mobility issues  Some associated conditions: Hypertension and Prediabetes  Contributing factors: Family history, Disruption of circadian rhythm, Stress, and Menopause  Weight promoting medications identified: None  Current nutrition plan: None  Current level of physical activity: Walking  Current or previous pharmacotherapy: Phentermine  Response to medication: Lost weight initially but was unable to sustain weight loss   Past medical history includes:   Past Medical History:  Diagnosis Date   Allergy    Arthritis      Objective:   BP 131/86   Pulse 100   Temp 97.7 F (36.5 C)   Ht 5\' 4"  (1.626 m)   SpO2 98%   BMI 37.76 kg/m  She was weighed on the bioimpedance scale: Body mass index is 37.76 kg/m.  Peak Weight:240 , Body Fat%:53 , Visceral Fat Rating:16, Weight trend over the last 12 months: Decreasing  General:  Alert, oriented and cooperative. Patient is in no acute distress.  Respiratory: Normal respiratory effort, no problems with respiration noted   Gait: able to ambulate independently  Mental Status: Normal mood and affect. Normal behavior. Normal judgment and thought content.   DIAGNOSTIC DATA REVIEWED:  BMET No results found for: "NA", "K", "CL", "CO2", "GLUCOSE", "BUN", "CREATININE", "CALCIUM", "GFRNONAA", "GFRAA" Lab  Results  Component Value Date   HGBA1C 5.9 02/06/2016   No results found for: "INSULIN" CBC No results found for: "WBC", "RBC", "HGB", "HCT", "PLT", "MCV", "MCH", "MCHC", "RDW" Iron/TIBC/Ferritin/ %Sat No results found for: "IRON", "TIBC", "FERRITIN", "IRONPCTSAT" Lipid Panel     Component Value Date/Time   CHOL 130 02/06/2016 1657   TRIG 80 02/06/2016 1657   HDL 45 (L) 02/06/2016 1657   CHOLHDL 2.9 02/06/2016 1657   VLDL 16 02/06/2016 1657   LDLCALC 69 02/06/2016 1657   Hepatic Function Panel  No results found for: "PROT", "ALBUMIN", "AST", "ALT", "ALKPHOS", "BILITOT", "BILIDIR", "IBILI" No results found for: "TSH"   Assessment and Plan:   Primary hypertension Assessment & Plan: Blood pressure slightly above target.  She is currently on low-dose hydrochlorothiazide but does not take medication regularly.  She does not like to take medications.  She has not been monitoring at home.  We will check renal parameters with intake labs and will advise patient on disease specific goals in an attempt to reduce the risk of complications and cardiovascular risk.   Prediabetes Assessment & Plan: Most recent hemoglobin A1c on file was 5.9 in 2017.  I checked in care everywhere and could not find additional test results.  We will be checking a fasting blood glucose, insulin levels and hemoglobin A1c with intake labs.  Patient also counseled on disease state and risk of progression.  Losing 10% of body weight may improve condition.   Primary osteoarthritis of both knees Assessment & Plan: Patient is experiencing significant discomfort and impaired mobility due to osteoarthritis of her knees and  biomechanical forces of her weight.  Losing 15% of her body weight may reduce discomfort and improve quality of life.  She is not interested at this time in joint replacement surgery.  This affects her ability to engage in gainful physical activity.   Class 2 severe obesity with serious comorbidity  and body mass index (BMI) of 37.0 to 37.9 in adult, unspecified obesity type [facility]) Assessment & Plan: We reviewed weight, biometrics, associated medical conditions and contributing factors with patient. She would benefit from weight loss therapy via a modified calorie, low-carb, high-protein nutritional plan tailored to their REE (resting energy expenditure) which will be determined by indirect calorimetry.  We will also assess for cardiometabolic risk and nutritional derangements via fasting serologies at her next appointment.         Obesity Treatment / Action Plan:  Patient will work on garnering support from family and friends to begin weight loss journey. Will work on eliminating or reducing the presence of highly palatable, calorie dense foods in the home. Will complete provided nutritional and psychosocial assessment questionnaire before the next appointment. Will be scheduled for indirect calorimetry to determine resting energy expenditure in a fasting state.  This will allow [place] to create a reduced calorie, high-protein meal plan to promote loss of fat mass while preserving muscle mass. Counseled on the health benefits of losing 5%-15% of total body weight. Was counseled on pharmacotherapy and role as an adjunct in weight management.   Obesity Education Performed Today:  She was weighed on the bioimpedance scale and results were discussed and documented in the synopsis.  We discussed obesity as a disease and the importance of a more detailed evaluation of all the factors contributing to the disease.  We discussed the importance of long term lifestyle changes which include nutrition, exercise and behavioral modifications as well as the importance of customizing this to her specific health and social needs.  We discussed the benefits of reaching a healthier weight to alleviate the symptoms of existing conditions and reduce the risks of the biomechanical, metabolic and psychological  effects of obesity.  Becky Hall appears to be in the action stage of change and states they are ready to start intensive lifestyle modifications and behavioral modifications.  30 minutes was spent today on this visit including the above counseling, pre-visit chart review, and post-visit documentation.  Reviewed by clinician on day of visit: allergies, medications, problem list, medical history, surgical history, family history, social history, and previous encounter notes pertinent to obesity diagnosis.   Worthy Rancher, MD

## 2023-04-09 ENCOUNTER — Ambulatory Visit (INDEPENDENT_AMBULATORY_CARE_PROVIDER_SITE_OTHER): Payer: Medicare HMO | Admitting: Internal Medicine

## 2023-04-23 ENCOUNTER — Ambulatory Visit (INDEPENDENT_AMBULATORY_CARE_PROVIDER_SITE_OTHER): Payer: Medicare HMO | Admitting: Internal Medicine

## 2023-05-07 ENCOUNTER — Ambulatory Visit (INDEPENDENT_AMBULATORY_CARE_PROVIDER_SITE_OTHER): Payer: BC Managed Care – PPO | Admitting: Internal Medicine

## 2023-05-07 ENCOUNTER — Encounter (INDEPENDENT_AMBULATORY_CARE_PROVIDER_SITE_OTHER): Payer: Self-pay | Admitting: Internal Medicine

## 2023-05-07 VITALS — BP 133/86 | HR 97 | Temp 97.8°F | Ht 64.0 in | Wt 220.0 lb

## 2023-05-07 DIAGNOSIS — M170 Bilateral primary osteoarthritis of knee: Secondary | ICD-10-CM

## 2023-05-07 DIAGNOSIS — R7303 Prediabetes: Secondary | ICD-10-CM | POA: Diagnosis not present

## 2023-05-07 DIAGNOSIS — R0602 Shortness of breath: Secondary | ICD-10-CM | POA: Diagnosis not present

## 2023-05-07 DIAGNOSIS — I10 Essential (primary) hypertension: Secondary | ICD-10-CM

## 2023-05-07 DIAGNOSIS — R5383 Other fatigue: Secondary | ICD-10-CM | POA: Insufficient documentation

## 2023-05-07 DIAGNOSIS — Z6837 Body mass index (BMI) 37.0-37.9, adult: Secondary | ICD-10-CM

## 2023-05-07 DIAGNOSIS — F32A Depression, unspecified: Secondary | ICD-10-CM | POA: Diagnosis not present

## 2023-05-07 DIAGNOSIS — Z1331 Encounter for screening for depression: Secondary | ICD-10-CM | POA: Insufficient documentation

## 2023-05-07 NOTE — Assessment & Plan Note (Signed)
Patient is experiencing significant discomfort and impaired mobility due to osteoarthritis of her knees and biomechanical forces of her weight.  Losing 15% of her body weight may reduce discomfort and improve quality of life.  She is not interested at this time in joint replacement surgery.  This affects her ability to engage in gainful physical activity. 

## 2023-05-07 NOTE — Progress Notes (Signed)
Chief Complaint:   OBESITY Becky Hall (MR# [id number]) is a 62 y.o. female who presents for evaluation and treatment of obesity and related comorbidities. Current BMI is Body mass index is 37.76 kg/m. Becky Hall has been struggling with her weight for many years and has been unsuccessful in either losing weight, maintaining weight loss, or reaching her healthy weight goal.  Becky Hall is currently in the action stage of change and ready to dedicate time achieving and maintaining a healthier weight. Becky Hall is interested in becoming our patient and working on intensive lifestyle modifications including (but not limited to) diet and exercise for weight loss.  Becky Hall's habits were reviewed today and are as follows: Her family eats meals together, she thinks her family will eat healthier with her, her desired weight loss is 40-60 lbs, she has been heavy most of her life, she started gaining weight over 10 years ago, her heaviest weight ever was 240 pounds, she has significant food cravings issues, she snacks frequently in the evenings, she skips meals frequently, she is frequently drinking liquids with calories, she frequently makes poor food choices, she frequently eats larger portions than normal, and she struggles with emotional eating.  Depression Screen Becky Hall's Food and Mood (modified PHQ-9) score was 12.  Subjective:   1. Other fatigue Becky Hall admits to daytime somnolence and admits to waking up still tired. Patient has a history of symptoms of daytime fatigue and morning fatigue. Becky Hall states that she has nightime awakenings. Snoring is present. Apneic episodes are not present. Epworth Sleepiness Score is 14.   2. SOB (shortness of breath) on exertion Becky Hall notes increasing shortness of breath with exercising and seems to be worsening over time with weight gain. She notes getting out of breath sooner with activity than she used to. This has not gotten worse recently. Becky Hall denies shortness of  breath at rest or orthopnea.  3. Primary hypertension Blood pressure is above goal.  She does not like to take medications and therefore has not been taking hydrochlorothiazide.  4. Prediabetes Based on history.  She has multiple risk factors including age, ethnicity, weight and family history.  5. Primary osteoarthritis of both knees Patient is experiencing significant discomfort and impaired mobility due to osteoarthritis of her knees and biomechanical forces of her weight.   Assessment/Plan:   1. Other fatigue Becky Hall does feel that her weight is causing her energy to be lower than it should be. Fatigue may be related to obesity, depression or many other causes. Labs will be ordered, and in the meanwhile, Becky Hall will focus on self care including making healthy food choices, increasing physical activity and focusing on stress reduction.  - EKG 12-Lead - Vitamin B12 - CBC with Differential/Platelet  2. SOB (shortness of breath) on exertion Becky Hall does feel that she gets out of breath more easily that she used to when she exercises. Becky Hall's shortness of breath appears to be obesity related and exercise induced. She has agreed to work on weight loss and gradually increase exercise to treat her exercise induced shortness of breath. Will continue to monitor closely.  3. Primary hypertension We will assess cardiovascular risk at the next office visit.  I counseled her on the risk associated with uncontrolled blood pressure particularly in high risk ethnic groups.  Losing 10% of body weight may improve blood pressure control.  - Comprehensive metabolic panel  4. Prediabetes We will check hemoglobin A1c, fasting blood sugar and insulin levels.  Losing 10% of body  weight will improve condition.  She may also be a candidate for pharmacoprophylaxis.  - Hemoglobin A1c - Insulin, random - Lipid Panel With LDL/HDL Ratio  5. Primary osteoarthritis of both knees Losing 15% of her body weight may  reduce discomfort and improve quality of life.  She is not interested at this time in joint replacement surgery.  This affects her ability to engage in gainful physical activity.  6. Depression screen Becky Hall had a positive depression screening. Depression is commonly associated with obesity and often results in emotional eating behaviors. We will monitor this closely and work on CBT to help improve the non-hunger eating patterns. Referral to Psychology may be required if no improvement is seen as she continues in our clinic.  7. Class 2 severe obesity with serious comorbidity and body mass index (BMI) of 37.0 to 37.9 in adult, unspecified obesity type (HCC) - TSH - VITAMIN D 25 Hydroxy (Vit-D Deficiency, Fractures)  Becky Hall is currently in the action stage of change and her goal is to continue with weight loss efforts. I recommend Becky Hall begin the structured treatment plan as follows:  She has agreed to the Category 2 Plan.  Exercise goals: No exercise has been prescribed at this time.   Behavioral modification strategies: increasing lean protein intake, decreasing simple carbohydrates, increasing vegetables, increasing water intake, decreasing liquid calories, increasing high fiber foods, decreasing eating out, no skipping meals, meal planning and cooking strategies, keeping healthy foods in the home, better snacking choices, avoiding temptations, and planning for success.  She was informed of the importance of frequent follow-up visits to maximize her success with intensive lifestyle modifications for her multiple health conditions. She was informed we would discuss her lab results at her next visit unless there is a critical issue that needs to be addressed sooner. Becky Hall agreed to keep her next visit at the agreed upon time to discuss these results.  Objective:   Blood pressure 133/86, pulse 97, temperature 97.8 F (36.6 C), height 5\' 4"  (1.626 m), weight 220 lb (99.8 kg), SpO2 98 %. Body mass  index is 37.76 kg/m.  EKG: Normal sinus rhythm, rate 99 BPM.  Indirect Calorimeter completed today shows a VO2 of 234 and a REE of 1613.  Her calculated basal metabolic rate is 4098 thus her basal metabolic rate is better than expected.  General: Cooperative, alert, well developed, in no acute distress. HEENT: Conjunctivae and lids unremarkable. Cardiovascular: Regular rhythm.  Lungs: Normal work of breathing. Neurologic: No focal deficits.   No results found for: "CREATININE", "BUN", "NA", "K", "CL", "CO2" No results found for: "ALT", "AST", "GGT", "ALKPHOS", "BILITOT" Lab Results  Component Value Date   HGBA1C 5.9 02/06/2016   No results found for: "INSULIN" No results found for: "TSH" Lab Results  Component Value Date   CHOL 130 02/06/2016   HDL 45 (L) 02/06/2016   LDLCALC 69 02/06/2016   TRIG 80 02/06/2016   CHOLHDL 2.9 02/06/2016   No results found for: "WBC", "HGB", "HCT", "MCV", "PLT" No results found for: "IRON", "TIBC", "FERRITIN"  Attestation Statements:   Reviewed by clinician on day of visit: allergies, medications, problem list, medical history, surgical history, family history, social history, and previous encounter notes.  Time spent on visit including pre-visit chart review and post-visit charting and care was 40 minutes.   Trude Mcburney, am acting as transcriptionist for Worthy Rancher, MD.  I have reviewed the above documentation for accuracy and completeness, and I agree with the above. -Worthy Rancher, MD

## 2023-05-07 NOTE — Assessment & Plan Note (Signed)
Based on history.  She has multiple risk factors including age, ethnicity, weight and family history.  We will check hemoglobin A1c, fasting blood sugar and insulin levels.  Losing 10% of body weight will improve condition.  She may also be a candidate for pharmacoprophylaxis.

## 2023-05-07 NOTE — Assessment & Plan Note (Signed)
Blood pressure is above goal.  She does not like to take medications and therefore has not been taking hydrochlorothiazide.  We will assess cardiovascular risk at the next office visit.  I counseled her on the risk associated with uncontrolled blood pressure particularly in high risk ethnic groups.  Losing 10% of body weight may improve blood pressure control.

## 2023-05-08 LAB — COMPREHENSIVE METABOLIC PANEL
ALT: 8 IU/L (ref 0–32)
AST: 13 IU/L (ref 0–40)
Albumin/Globulin Ratio: 1.1
Albumin: 4 g/dL (ref 3.9–4.9)
Alkaline Phosphatase: 95 IU/L (ref 44–121)
BUN/Creatinine Ratio: 19 (ref 12–28)
BUN: 12 mg/dL (ref 8–27)
Bilirubin Total: 0.7 mg/dL (ref 0.0–1.2)
CO2: 23 mmol/L (ref 20–29)
Calcium: 9.5 mg/dL (ref 8.7–10.3)
Chloride: 106 mmol/L (ref 96–106)
Creatinine, Ser: 0.62 mg/dL (ref 0.57–1.00)
Globulin, Total: 3.6 g/dL (ref 1.5–4.5)
Glucose: 97 mg/dL (ref 70–99)
Potassium: 4.5 mmol/L (ref 3.5–5.2)
Sodium: 142 mmol/L (ref 134–144)
Total Protein: 7.6 g/dL (ref 6.0–8.5)
eGFR: 99 mL/min/{1.73_m2} (ref >59)

## 2023-05-08 LAB — CBC WITH DIFFERENTIAL/PLATELET
Basophils Absolute: 0 10*3/uL (ref 0.0–0.2)
Basos: 1 %
EOS (ABSOLUTE): 0.1 10*3/uL (ref 0.0–0.4)
Eos: 2 %
Hematocrit: 39.7 % (ref 34.0–46.6)
Hemoglobin: 12.9 g/dL (ref 11.1–15.9)
Immature Grans (Abs): 0 10*3/uL (ref 0.0–0.1)
Immature Granulocytes: 0 %
Lymphocytes Absolute: 2.3 10*3/uL (ref 0.7–3.1)
Lymphs: 40 %
MCH: 30.4 pg (ref 26.6–33.0)
MCHC: 32.5 g/dL (ref 31.5–35.7)
MCV: 94 fL (ref 79–97)
Monocytes Absolute: 0.4 10*3/uL (ref 0.1–0.9)
Monocytes: 7 %
Neutrophils Absolute: 2.8 10*3/uL (ref 1.4–7.0)
Neutrophils: 50 %
Platelets: 262 10*3/uL (ref 150–450)
RBC: 4.24 x10E6/uL (ref 3.77–5.28)
RDW: 12.4 % (ref 11.7–15.4)
WBC: 5.7 10*3/uL (ref 3.4–10.8)

## 2023-05-08 LAB — LIPID PANEL WITH LDL/HDL RATIO
Cholesterol, Total: 140 mg/dL (ref 100–199)
HDL: 63 mg/dL (ref >39)
LDL Chol Calc (NIH): 66 mg/dL (ref 0–99)
LDL/HDL Ratio: 1 ratio (ref 0.0–3.2)
Triglycerides: 50 mg/dL (ref 0–149)
VLDL Cholesterol Cal: 11 mg/dL (ref 5–40)

## 2023-05-08 LAB — VITAMIN D 25 HYDROXY (VIT D DEFICIENCY, FRACTURES): Vit D, 25-Hydroxy: 24.9 ng/mL — ABNORMAL LOW (ref 30.0–100.0)

## 2023-05-08 LAB — INSULIN, RANDOM: INSULIN: 6.1 u[IU]/mL (ref 2.6–24.9)

## 2023-05-08 LAB — HEMOGLOBIN A1C
Est. average glucose Bld gHb Est-mCnc: 117 mg/dL
Hgb A1c MFr Bld: 5.7 % — ABNORMAL HIGH (ref 4.8–5.6)

## 2023-05-08 LAB — VITAMIN B12: Vitamin B-12: 587 pg/mL (ref 232–1245)

## 2023-05-08 LAB — TSH: TSH: <0.005 u[IU]/mL — ABNORMAL LOW (ref 0.450–4.500)

## 2023-05-21 ENCOUNTER — Ambulatory Visit (INDEPENDENT_AMBULATORY_CARE_PROVIDER_SITE_OTHER): Payer: BC Managed Care – PPO | Admitting: Internal Medicine

## 2023-05-21 ENCOUNTER — Encounter (INDEPENDENT_AMBULATORY_CARE_PROVIDER_SITE_OTHER): Payer: Self-pay | Admitting: Internal Medicine

## 2023-05-21 VITALS — BP 117/83 | HR 92 | Temp 98.4°F | Ht 64.0 in | Wt 224.0 lb

## 2023-05-21 DIAGNOSIS — I10 Essential (primary) hypertension: Secondary | ICD-10-CM

## 2023-05-21 DIAGNOSIS — R7303 Prediabetes: Secondary | ICD-10-CM | POA: Diagnosis not present

## 2023-05-21 DIAGNOSIS — R7989 Other specified abnormal findings of blood chemistry: Secondary | ICD-10-CM | POA: Diagnosis not present

## 2023-05-21 DIAGNOSIS — E559 Vitamin D deficiency, unspecified: Secondary | ICD-10-CM

## 2023-05-21 DIAGNOSIS — Z6838 Body mass index (BMI) 38.0-38.9, adult: Secondary | ICD-10-CM

## 2023-05-21 MED ORDER — VITAMIN D3 50 MCG (2000 UT) PO CAPS: 2000.0000 [IU] | ORAL_CAPSULE | Freq: Every day | ORAL | Status: DC

## 2023-05-21 NOTE — Assessment & Plan Note (Signed)
Blood pressure today is well-controlled.  She does not like to take medications and therefore had not been taking hydrochlorothiazide.  We reviewed cardiovascular risk.  At present time she does not need further treatment intensification or use of statin therapy.  Continue hydrochlorothiazide.  Kidney function and electrolytes were within normal limits  The 10-year ASCVD risk score (Arnett DK, et al., 2019) is: 4.8%   Values used to calculate the score:     Age: 62 years     Sex: Female     Is Non-Hispanic African American: Yes     Diabetic: No     Tobacco smoker: No     Systolic Blood Pressure: 117 mmHg     Is BP treated: Yes     HDL Cholesterol: 63 mg/dL     Total Cholesterol: 140 mg/dL

## 2023-05-21 NOTE — Assessment & Plan Note (Signed)
Most recent A1c is  Lab Results  Component Value Date   HGBA1C 5.7 (H) 05/07/2023   HGBA1C 5.9 02/06/2016    Patient aware of disease state and risk of progression. This may contribute to abnormal cravings, fatigue and diabetic complications without having diabetes.   We reviewed treatment options which includes losing 7 to 10% of body weight, increasing physical activity to a goal of 150 minutes a week at moderate intensity.  She will work on reducing processed foods, simple and added sugars.  We reviewed reading food labels today.  She will try to avoid foods and snacks to have more than 10% of calories coming from added sugars.  She may also be a candidate for pharmacoprophylaxis with metformin or incretin mimetic.

## 2023-05-21 NOTE — Assessment & Plan Note (Signed)
Reviewed recent labs her TSH is suppressed.  I reviewed with her some of the causes of hyperthyroidism.  She states this is known by her PCP and she has family history of Graves' disease.  She has been experiencing palpitations on and off and I feel that this needs to be further investigated as she is at risk for atrial arrhythmias.  I recommend that she schedule an appointment with her primary care physician over the next 7 days for further workup.

## 2023-05-21 NOTE — Assessment & Plan Note (Signed)
Most recent vitamin D levels  Lab Results  Component Value Date   VD25OH 24.9 (L) 05/07/2023     Deficiency state associated with adiposity and may result in leptin resistance, weight gain and fatigue.  Plan: Start vitamin D3 2000 international units daily over-the-counter

## 2023-05-21 NOTE — Progress Notes (Signed)
Office: [phone]  /  Fax: [phone]  WEIGHT SUMMARY AND BIOMETRICS  Vitals Temp: 98.4 F (36.9 C) BP: 117/83 Pulse Rate: 92 SpO2: 96 %   Anthropometric Measurements Height: 5\' 4"  (1.626 m) Weight: 224 lb (101.6 kg) BMI (Calculated): 38.43 Weight at Last Visit: 220lb Weight Lost Since Last Visit: 0 Weight Gained Since Last Visit: 4lb Starting Weight: 220lb Total Weight Loss (lbs): 0 lb (0 kg) Peak Weight: 240lb Waist Measurement : 41 inches   Body Composition  Body Fat %: 54.2 % Fat Mass (lbs): 121.4 lbs Muscle Mass (lbs): 97.4 lbs Visceral Fat Rating : 17    No data recorded Today's Visit #: 2  Starting Date: 05/07/23   HPI  Chief Complaint: OBESITY  Becky Hall is here to discuss her progress with her obesity treatment plan. She is on the the Category 2 Plan and states she is following her eating plan approximately 20 % of the time. She states she is exercising at the [facility] 90 minutes 3 times per week.  Interval History:  Since last office visit she has gained 4 pounds. She reports has not implemented reduced calorie nutritional plan She has not bought groceries and acknowledges having difficulties reading food labels and understanding healthy eating.  She is partnering with her sister and is thinking about starting meal prep.  She does not like to eat vegetables, was eating out often and was skipping dinner.  [facility] is limited due to osteoarthritis of her knees.  Orixegenic Control: Reports problems with appetite and hunger signals.  Denies problems with satiety and satiation.  Denies problems with eating patterns and portion control.  Denies abnormal cravings. Denies feeling deprived or restricted.   Barriers identified: having difficulty preparing healthy meals, having difficulty with meal prep and planning, having difficulty focusing on healthy eating, exposure to enticing environments and or relationships,  orthopedic problems, medical conditions or chronic pain affecting mobility, predilection for convenience or prepackaged foods, and low nutritional literacy .   Pharmacotherapy for weight loss: She is currently taking no anti-obesity medication.    ASSESSMENT AND PLAN  TREATMENT PLAN FOR OBESITY:  Recommended Dietary Goals  Becky Hall is currently in the action stage of change. As such, her goal is to continue weight management plan. She has agreed to: continue current plan  Behavioral Intervention  We discussed the following Behavioral Modification Strategies today: increasing lean protein intake, decreasing simple carbohydrates , increasing vegetables, increasing lower glycemic fruits, increasing fiber rich foods, avoiding skipping meals, increasing water intake, work on meal planning and preparation, reading food labels , decreasing eating out or consumption of processed foods, and making healthy choices when eating convenient foods, continue to work on implementation of reduced calorie nutritional plan, planning for success, and better snacking choices.  Additional resources provided today:  She will look at skinny taste and budget bites for recipe ideas  Recommended Physical Activity Goals  Becky Hall has been advised to work up to 150 minutes of moderate intensity aerobic activity a week and strengthening exercises 2-3 times per week for cardiovascular health, weight loss maintenance and preservation of muscle mass.   She has agreed to :  Continue current level of physical activity   Pharmacotherapy We discussed various medication options to help Becky Hall with her weight loss efforts and we both agreed to : continue with nutritional and behavioral strategies  ASSOCIATED CONDITIONS ADDRESSED TODAY  Prediabetes Assessment & Plan: Most recent A1c is  Lab Results  Component Value Date   HGBA1C 5.7 (H) 05/07/2023   HGBA1C 5.9 02/06/2016    Patient aware of disease state and risk of  progression. This may contribute to abnormal cravings, fatigue and diabetic complications without having diabetes.   We reviewed treatment options which includes losing 7 to 10% of body weight, increasing physical activity to a goal of 150 minutes a week at moderate intensity.  She will work on reducing processed foods, simple and added sugars.  We reviewed reading food labels today.  She will try to avoid foods and snacks to have more than 10% of calories coming from added sugars.  She may also be a candidate for pharmacoprophylaxis with metformin or incretin mimetic.     Primary hypertension Assessment & Plan: Blood pressure today is well-controlled.  She does not like to take medications and therefore had not been taking hydrochlorothiazide.  We reviewed cardiovascular risk.  At present time she does not need further treatment intensification or use of statin therapy.  Continue hydrochlorothiazide.  Kidney function and electrolytes were within normal limits  The 10-year ASCVD risk score (Arnett DK, et al., 2019) is: 4.8%   Values used to calculate the score:     Age: 62 years     Sex: Female     Is Non-Hispanic African American: Yes     Diabetic: No     Tobacco smoker: No     Systolic Blood Pressure: 117 mmHg     Is BP treated: Yes     HDL Cholesterol: 63 mg/dL     Total Cholesterol: 140 mg/dL    Class 2 severe obesity with serious comorbidity and body mass index (BMI) of 37.0 to 37.9 in adult, unspecified obesity type (HCC)  Decreased thyroid stimulating hormone (TSH) level Assessment & Plan: Reviewed recent labs her TSH is suppressed.  I reviewed with her some of the causes of hyperthyroidism.  She states this is known by her PCP and she has family history of Graves' disease.  She has been experiencing palpitations on and off and I feel that this needs to be further investigated as she is at risk for atrial arrhythmias.  I recommend that she schedule an appointment with her primary  care physician over the next 7 days for further workup.   Vitamin D deficiency Assessment & Plan: Most recent vitamin D levels  Lab Results  Component Value Date   VD25OH 24.9 (L) 05/07/2023     Deficiency state associated with adiposity and may result in leptin resistance, weight gain and fatigue.  Plan: Start vitamin D3 2000 international units daily over-the-counter   Orders: -     Vitamin D3; Take 1 capsule (2,000 Units total) by mouth daily.    PHYSICAL EXAM:  Blood pressure 117/83, pulse 92, temperature 98.4 F (36.9 C), height 5\' 4"  (1.626 m), weight 224 lb (101.6 kg), SpO2 96 %. Body mass index is 38.45 kg/m.  General: She is overweight, cooperative, alert, well developed, and in no acute distress. PSYCH: Has normal mood, affect and thought process.   HEENT: EOMI, sclerae are anicteric. Lungs: Normal breathing effort, no conversational dyspnea. Extremities: No edema.  Neurologic: No gross sensory or motor deficits. No tremors or fasciculations noted.    DIAGNOSTIC DATA REVIEWED:  BMET    Component Value Date/Time   NA 142 05/07/2023 0917   K 4.5 05/07/2023 0917   CL 106 05/07/2023 0917   CO2 23 05/07/2023 0917   GLUCOSE 97 05/07/2023 0917   BUN  12 05/07/2023 0917   CREATININE 0.62 05/07/2023 0917   CALCIUM 9.5 05/07/2023 0917   Lab Results  Component Value Date   HGBA1C 5.7 (H) 05/07/2023   HGBA1C 5.9 02/06/2016   Lab Results  Component Value Date   INSULIN 6.1 05/07/2023   Lab Results  Component Value Date   TSH <0.005 (L) 05/07/2023   CBC    Component Value Date/Time   WBC 5.7 05/07/2023 0917   RBC 4.24 05/07/2023 0917   HGB 12.9 05/07/2023 0917   HCT 39.7 05/07/2023 0917   PLT 262 05/07/2023 0917   MCV 94 05/07/2023 0917   MCH 30.4 05/07/2023 0917   MCHC 32.5 05/07/2023 0917   RDW 12.4 05/07/2023 0917   Iron Studies No results found for: "IRON", "TIBC", "FERRITIN", "IRONPCTSAT" Lipid Panel     Component Value Date/Time   CHOL  140 05/07/2023 0917   TRIG 50 05/07/2023 0917   HDL 63 05/07/2023 0917   CHOLHDL 2.9 02/06/2016 1657   VLDL 16 02/06/2016 1657   LDLCALC 66 05/07/2023 0917   Hepatic Function Panel     Component Value Date/Time   PROT 7.6 05/07/2023 0917   ALBUMIN 4.0 05/07/2023 0917   AST 13 05/07/2023 0917   ALT 8 05/07/2023 0917   ALKPHOS 95 05/07/2023 0917   BILITOT 0.7 05/07/2023 0917      Component Value Date/Time   TSH <0.005 (L) 05/07/2023 0917   Nutritional Lab Results  Component Value Date   VD25OH 24.9 (L) 05/07/2023     Return in about 2 weeks (around 06/04/2023) for For Weight Mangement with Dr. Rikki Spearing.Marland Kitchen She was informed of the importance of frequent follow up visits to maximize her success with intensive lifestyle modifications for her multiple health conditions.   ATTESTASTION STATEMENTS:  Reviewed by clinician on day of visit: allergies, medications, problem list, medical history, surgical history, family history, social history, and previous encounter notes.     Worthy Rancher, MD

## 2023-05-28 ENCOUNTER — Telehealth (INDEPENDENT_AMBULATORY_CARE_PROVIDER_SITE_OTHER): Payer: Self-pay | Admitting: Internal Medicine

## 2023-05-28 NOTE — Telephone Encounter (Signed)
Patient stated she needs her thyroid count level/result back to her. Please give her a call as soon as possible. Please call patient as soon as possible,you can also mychart her

## 2023-06-12 ENCOUNTER — Ambulatory Visit (INDEPENDENT_AMBULATORY_CARE_PROVIDER_SITE_OTHER): Payer: BC Managed Care – PPO | Admitting: Internal Medicine

## 2023-06-12 ENCOUNTER — Encounter (INDEPENDENT_AMBULATORY_CARE_PROVIDER_SITE_OTHER): Payer: Self-pay | Admitting: Internal Medicine

## 2023-06-12 VITALS — BP 117/77 | HR 105 | Temp 97.8°F | Ht 64.0 in | Wt 221.0 lb

## 2023-06-12 DIAGNOSIS — I10 Essential (primary) hypertension: Secondary | ICD-10-CM

## 2023-06-12 DIAGNOSIS — R7303 Prediabetes: Secondary | ICD-10-CM | POA: Diagnosis not present

## 2023-06-12 DIAGNOSIS — Z6837 Body mass index (BMI) 37.0-37.9, adult: Secondary | ICD-10-CM

## 2023-06-12 NOTE — Progress Notes (Signed)
Office: [phone]  /  Fax: [phone]  WEIGHT SUMMARY AND BIOMETRICS  Vitals Temp: 97.8 F (36.6 C) BP: 117/77 Pulse Rate: (!) 105 SpO2: 96 %   Anthropometric Measurements Height: 5\' 4"  (1.626 m) Weight: 221 lb (100.2 kg) BMI (Calculated): 37.92 Weight at Last Visit: 224 lb Weight Lost Since Last Visit: 3 lb Weight Gained Since Last Visit: 0 Starting Weight: 220 lb Total Weight Loss (lbs): 0 lb (0 kg) Peak Weight: 240 lb Waist Measurement : 41 inches   Body Composition  Body Fat %: 53.9 % Fat Mass (lbs): 119.4 lbs Muscle Mass (lbs): 97 lbs Visceral Fat Rating : 17    No data recorded Today's Visit #: 3  Starting Date: 05/07/23   HPI  Chief Complaint: OBESITY  Becky Hall is here to discuss her progress with her obesity treatment plan. She is on the the Category 2 Plan and states she is following her eating plan approximately 50 % of the time. She states she is exercising at the [facility] 60 minutes 3 times per week.  Interval History:  Since last office visit she has lost 3 lb. She has been looking at other nutritional plans online, she wants variety but needs ideas and guidance.  She is trying to get in more vegetables and protein.  She does not eat vegetables in general.  She has been eating eggs, tuna and cottage cheese as a source of protein.  She is also been eating some lean meats with vegetables. She has been working on increasing protein intake at every meal, eating more vegetables, drinking more water, avoiding and / or reducing liquid calories, avoiding or reducing simple and processed carbohydrates, making healthier choices, and continues to exercise  Orixegenic Control: Denies problems with appetite and hunger signals.  Denies problems with satiety and satiation.  Denies problems with eating patterns and portion control.  Denies abnormal cravings. Denies feeling deprived or restricted.   Barriers identified: having difficulty preparing healthy meals,  having difficulty with meal prep and planning, and predilection for convenience or prepackaged foods.   Pharmacotherapy for weight loss: She is currently taking no anti-obesity medication.    ASSESSMENT AND PLAN  TREATMENT PLAN FOR OBESITY:  Recommended Dietary Goals  Becky Hall is currently in the action stage of change. As such, her goal is to continue weight management plan. She has agreed to: Patient provided with an AI generated 5-day low-carb high-protein plan with a target of 1200 cal and 90 to 100 g of protein.  Behavioral Intervention  We discussed the following Behavioral Modification Strategies today: increasing lean protein intake, decreasing simple carbohydrates , increasing vegetables, increasing lower glycemic fruits, increasing fiber rich foods, increasing water intake, work on meal planning and preparation, keeping healthy foods at home, decreasing eating out or consumption of processed foods, and making healthy choices when eating convenient foods, and planning for success.  Additional resources provided today: Personalized instruction on the use of artificial intelligence for recipes, calorie tracking, and finding healthier options when eating out.   Recommended Physical Activity Goals  Becky Hall has been advised to work up to 150 minutes of moderate intensity aerobic activity a week and strengthening exercises 2-3 times per week for cardiovascular health, weight loss maintenance and preservation of muscle mass.   She has agreed to :  Think about ways to increase daily physical activity and overcoming barriers to exercise  Pharmacotherapy We discussed various medication options to help Becky Hall with her weight loss efforts and we both agreed to :  continue with nutritional and behavioral strategies  ASSOCIATED CONDITIONS ADDRESSED TODAY  Primary hypertension Assessment & Plan: Blood pressure today is well-controlled.  She reports taking hydrochlorothiazide and does not like to  take medications.  She denies any adverse effects.  She will continue blood pressure medication.  Losing 10% of body weight may improve blood pressure control.   Prediabetes Assessment & Plan: Most recent A1c is  Lab Results  Component Value Date   HGBA1C 5.7 (H) 05/07/2023   HGBA1C 5.9 02/06/2016    Patient aware of disease state and risk of progression. This may contribute to abnormal cravings, fatigue and diabetic complications without having diabetes.   We reviewed treatment options which includes losing 7 to 10% of body weight, increasing physical activity to a goal of 150 minutes a week at moderate intensity.  She will work on reducing processed foods, simple and added sugars.  We reviewed reading food labels today.  She will try to avoid foods and snacks to have more than 10% of calories coming from added sugars.  She may also be a candidate for pharmacoprophylaxis with metformin or incretin mimetic.     Class 2 severe obesity with serious comorbidity and body mass index (BMI) of 37.0 to 37.9 in adult, unspecified obesity type (HCC)    PHYSICAL EXAM:  Blood pressure 117/77, pulse (!) 105, temperature 97.8 F (36.6 C), height 5\' 4"  (1.626 m), weight 221 lb (100.2 kg), SpO2 96%. Body mass index is 37.93 kg/m.  General: She is overweight, cooperative, alert, well developed, and in no acute distress. PSYCH: Has normal mood, affect and thought process.   HEENT: EOMI, sclerae are anicteric. Lungs: Normal breathing effort, no conversational dyspnea. Extremities: No edema.  Neurologic: No gross sensory or motor deficits. No tremors or fasciculations noted.    DIAGNOSTIC DATA REVIEWED:  BMET    Component Value Date/Time   NA 142 05/07/2023 0917   K 4.5 05/07/2023 0917   CL 106 05/07/2023 0917   CO2 23 05/07/2023 0917   GLUCOSE 97 05/07/2023 0917   BUN 12 05/07/2023 0917   CREATININE 0.62 05/07/2023 0917   CALCIUM 9.5 05/07/2023 0917   Lab Results  Component Value  Date   HGBA1C 5.7 (H) 05/07/2023   HGBA1C 5.9 02/06/2016   Lab Results  Component Value Date   INSULIN 6.1 05/07/2023   Lab Results  Component Value Date   TSH <0.005 (L) 05/07/2023   CBC    Component Value Date/Time   WBC 5.7 05/07/2023 0917   RBC 4.24 05/07/2023 0917   HGB 12.9 05/07/2023 0917   HCT 39.7 05/07/2023 0917   PLT 262 05/07/2023 0917   MCV 94 05/07/2023 0917   MCH 30.4 05/07/2023 0917   MCHC 32.5 05/07/2023 0917   RDW 12.4 05/07/2023 0917   Iron Studies No results found for: "IRON", "TIBC", "FERRITIN", "IRONPCTSAT" Lipid Panel     Component Value Date/Time   CHOL 140 05/07/2023 0917   TRIG 50 05/07/2023 0917   HDL 63 05/07/2023 0917   CHOLHDL 2.9 02/06/2016 1657   VLDL 16 02/06/2016 1657   LDLCALC 66 05/07/2023 0917   Hepatic Function Panel     Component Value Date/Time   PROT 7.6 05/07/2023 0917   ALBUMIN 4.0 05/07/2023 0917   AST 13 05/07/2023 0917   ALT 8 05/07/2023 0917   ALKPHOS 95 05/07/2023 0917   BILITOT 0.7 05/07/2023 0917      Component Value Date/Time   TSH <0.005 (L) 05/07/2023 1610  Nutritional Lab Results  Component Value Date   VD25OH 24.9 (L) 05/07/2023     Return in about 4 weeks (around 07/10/2023) for For Weight Mangement with Dr. Rikki Spearing.Marland Kitchen She was informed of the importance of frequent follow up visits to maximize her success with intensive lifestyle modifications for her multiple health conditions.   ATTESTASTION STATEMENTS:  Reviewed by clinician on day of visit: allergies, medications, problem list, medical history, surgical history, family history, social history, and previous encounter notes.   I have spent 30 minutes in the care of the patient today including: preparing to see patient (e.g. review and interpretation of tests, old notes ), obtaining and/or reviewing separately obtained history, counseling and educating the patient, documenting clinical information in the electronic or other health care record,  and independently interpreting results and communicating results to the patient,family, or caregiver   Worthy Rancher, MD

## 2023-06-12 NOTE — Assessment & Plan Note (Signed)
Blood pressure today is well-controlled.  She reports taking hydrochlorothiazide and does not like to take medications.  She denies any adverse effects.  She will continue blood pressure medication.  Losing 10% of body weight may improve blood pressure control.

## 2023-06-12 NOTE — Assessment & Plan Note (Signed)
Most recent A1c is  Lab Results  Component Value Date   HGBA1C 5.7 (H) 05/07/2023   HGBA1C 5.9 02/06/2016    Patient aware of disease state and risk of progression. This may contribute to abnormal cravings, fatigue and diabetic complications without having diabetes.   We reviewed treatment options which includes losing 7 to 10% of body weight, increasing physical activity to a goal of 150 minutes a week at moderate intensity.  She will work on reducing processed foods, simple and added sugars.  We reviewed reading food labels today.  She will try to avoid foods and snacks to have more than 10% of calories coming from added sugars.  She may also be a candidate for pharmacoprophylaxis with metformin or incretin mimetic.

## 2023-07-10 ENCOUNTER — Encounter (INDEPENDENT_AMBULATORY_CARE_PROVIDER_SITE_OTHER): Payer: Self-pay | Admitting: Internal Medicine

## 2023-07-10 ENCOUNTER — Ambulatory Visit (INDEPENDENT_AMBULATORY_CARE_PROVIDER_SITE_OTHER): Payer: BC Managed Care – PPO | Admitting: Internal Medicine

## 2023-07-10 VITALS — BP 107/76 | HR 104 | Temp 98.1°F | Ht 64.0 in | Wt 221.0 lb

## 2023-07-10 DIAGNOSIS — Z6837 Body mass index (BMI) 37.0-37.9, adult: Secondary | ICD-10-CM | POA: Diagnosis not present

## 2023-07-10 DIAGNOSIS — R7303 Prediabetes: Secondary | ICD-10-CM | POA: Diagnosis not present

## 2023-07-10 DIAGNOSIS — I10 Essential (primary) hypertension: Secondary | ICD-10-CM | POA: Diagnosis not present

## 2023-07-10 NOTE — Assessment & Plan Note (Signed)
Blood pressure at goal for age and risk category.  On hydrochlorothiazide without adverse effects.  Most recent renal parameters reviewed which showed normal electrolytes and kidney function.  Continue with weight loss therapy. Losing 10% may improve blood pressure control. Monitor for symptoms of orthostasis while losing weight. Continue current regimen and home monitoring for a goal blood pressure of 120/80.

## 2023-07-10 NOTE — Assessment & Plan Note (Signed)
Most recent A1c is  Lab Results  Component Value Date   HGBA1C 5.7 (H) 05/07/2023   HGBA1C 5.9 02/06/2016    Patient aware of disease state and risk of progression. This may contribute to abnormal cravings, fatigue and diabetic complications without having diabetes.   We have discussed treatment options which include: losing 7 to 10% of body weight, increasing physical activity to a goal of 150 minutes a week at moderate intensity.  Advised to maintain a diet low on simple and processed carbohydrates.  She may also be a candidate for pharmacoprophylaxis with metformin or incretin mimetic.

## 2023-07-10 NOTE — Progress Notes (Signed)
Office: [phone]  /  Fax: [phone]  WEIGHT SUMMARY AND BIOMETRICS  Vitals Temp: 98.1 F (36.7 C) BP: 107/76 Pulse Rate: (!) 104 SpO2: 97 %   Anthropometric Measurements Height: 5\' 4"  (1.626 m) Weight: 221 lb (100.2 kg) BMI (Calculated): 37.92 Weight at Last Visit: 221 lb Weight Lost Since Last Visit: 0 lb Weight Gained Since Last Visit: 0 lb Starting Weight: 220 lb Total Weight Loss (lbs): 0 lb (0 kg) Peak Weight: 240 lb   Body Composition  Body Fat %: 54.2 % Fat Mass (lbs): 119.8 lbs Muscle Mass (lbs): 101.4 lbs Visceral Fat Rating : 17    No data recorded Today's Visit #: 4  Starting Date: 05/07/23   HPI  Chief Complaint: OBESITY  Becky Hall is here to discuss her progress with her obesity treatment plan. She is on the the Category 2 Plan and states she is following her eating plan approximately 30 % of the time. She states she is exercising 90 minutes 3 times per week.  Interval History:  Since last office visit she has maintained. She reports working on implementation of reduced calorie nutritional plan She has been working on not skipping meals, increasing protein intake at every meal, drinking more water, and begun to exercise  Orixegenic Control: Denies problems with appetite and hunger signals.  Denies problems with satiety and satiation.  Reports problems with eating patterns and portion control.  Reports abnormal cravings. Denies feeling deprived or restricted.   Barriers identified: strong hunger signals and appetite, having difficulty preparing healthy meals, having difficulty with meal prep and planning, having difficulty focusing on healthy eating, exposure to enticing environments and/or relationships, orthopedic problems, medical conditions or chronic pain affecting mobility, difficulty implementing reduced calorie nutrition plan, and difficulty maintaining a reduced calorie state.   Pharmacotherapy for weight loss: She is currently  taking no anti-obesity medication.    ASSESSMENT AND PLAN  TREATMENT PLAN FOR OBESITY:  Recommended Dietary Goals  Mystique is currently in the action stage of change. As such, her goal is to continue weight management plan. She has agreed to: switch to time restricted eating.  Patient would like to try this.  She was given a plan for 1100 cal, 18:6 ratio.  She will start once to twice a week.  We also discussed making healthier choices when eating out.  Behavioral Intervention  We discussed the following Behavioral Modification Strategies today: increasing lean protein intake, decreasing simple carbohydrates , increasing vegetables, increasing lower glycemic fruits, increasing water intake, work on meal planning and preparation, reading food labels , decreasing eating out or consumption of processed foods, and making healthy choices when eating convenient foods, continue to practice mindfulness when eating, and planning for success.  Additional resources provided today:  Time restricted 1100-calorie meal plan  Recommended Physical Activity Goals  [place]ma has been advised to work up to 150 minutes of moderate intensity aerobic activity a week and strengthening exercises 2-3 times per week for cardiovascular health, weight loss maintenance and preservation of muscle mass.   She has agreed to :  Continue current level of physical activity   Pharmacotherapy We discussed various medication options to help Sujey with her weight loss efforts and we both agreed to : continue with nutritional and behavioral strategies  ASSOCIATED CONDITIONS ADDRESSED TODAY  Prediabetes Assessment & Plan: Most recent A1c is  Lab Results  Component Value Date   HGBA1C 5.7 (H) 05/07/2023   HGBA1C 5.9 02/06/2016    Patient aware of disease state  and risk of progression. This may contribute to abnormal cravings, fatigue and diabetic complications without having diabetes.   We have discussed treatment options  which include: losing 7 to 10% of body weight, increasing physical activity to a goal of 150 minutes a week at moderate intensity.  Advised to maintain a diet low on simple and processed carbohydrates.  She may also be a candidate for pharmacoprophylaxis with metformin or incretin mimetic.     Class 2 severe obesity with serious comorbidity and body mass index (BMI) of 37.0 to 37.9 in adult, unspecified obesity type (HCC)  Primary hypertension Assessment & Plan: Blood pressure at goal for age and risk category.  On hydrochlorothiazide without adverse effects.  Most recent renal parameters reviewed which showed normal electrolytes and kidney function.  Continue with weight loss therapy. Losing 10% may improve blood pressure control. Monitor for symptoms of orthostasis while losing weight. Continue current regimen and home monitoring for a goal blood pressure of 120/80.       PHYSICAL EXAM:  Blood pressure 107/76, pulse (!) 104, temperature 98.1 F (36.7 C), height 5\' 4"  (1.626 m), weight 221 lb (100.2 kg), SpO2 97%. Body mass index is 37.93 kg/m.  General: She is overweight, cooperative, alert, well developed, and in no acute distress. PSYCH: Has normal mood, affect and thought process.   HEENT: EOMI, sclerae are anicteric. Lungs: Normal breathing effort, no conversational dyspnea. Extremities: No edema.  Neurologic: No gross sensory or motor deficits. No tremors or fasciculations noted.    DIAGNOSTIC DATA REVIEWED:  BMET    Component Value Date/Time   NA 142 05/07/2023 0917   K 4.5 05/07/2023 0917   CL 106 05/07/2023 0917   CO2 23 05/07/2023 0917   GLUCOSE 97 05/07/2023 0917   BUN 12 05/07/2023 0917   CREATININE 0.62 05/07/2023 0917   CALCIUM 9.5 05/07/2023 0917   Lab Results  Component Value Date   HGBA1C 5.7 (H) 05/07/2023   HGBA1C 5.9 02/06/2016   Lab Results  Component Value Date   INSULIN 6.1 05/07/2023   Lab Results  Component Value Date   TSH <0.005  (L) 05/07/2023   CBC    Component Value Date/Time   WBC 5.7 05/07/2023 0917   RBC 4.24 05/07/2023 0917   HGB 12.9 05/07/2023 0917   HCT 39.7 05/07/2023 0917   PLT 262 05/07/2023 0917   MCV 94 05/07/2023 0917   MCH 30.4 05/07/2023 0917   MCHC 32.5 05/07/2023 0917   RDW 12.4 05/07/2023 0917   Iron Studies No results found for: "IRON", "TIBC", "FERRITIN", "IRONPCTSAT" Lipid Panel     Component Value Date/Time   CHOL 140 05/07/2023 0917   TRIG 50 05/07/2023 0917   HDL 63 05/07/2023 0917   CHOLHDL 2.9 02/06/2016 1657   VLDL 16 02/06/2016 1657   LDLCALC 66 05/07/2023 0917   Hepatic Function Panel     Component Value Date/Time   PROT 7.6 05/07/2023 0917   ALBUMIN 4.0 05/07/2023 0917   AST 13 05/07/2023 0917   ALT 8 05/07/2023 0917   ALKPHOS 95 05/07/2023 0917   BILITOT 0.7 05/07/2023 0917      Component Value Date/Time   TSH <0.005 (L) 05/07/2023 0917   Nutritional Lab Results  Component Value Date   VD25OH 24.9 (L) 05/07/2023     Return in about 3 weeks (around 07/31/2023) for For Weight Mangement with Dr. Rikki Spearing.Marland Kitchen She was informed of the importance of frequent follow up visits to maximize her success with intensive  lifestyle modifications for her multiple health conditions.   ATTESTASTION STATEMENTS:  Reviewed by clinician on day of visit: allergies, medications, problem list, medical history, surgical history, family history, social history, and previous encounter notes.   I have spent 30 minutes in the care of the patient today including: preparing to see patient (e.g. review and interpretation of tests, old notes ), obtaining and/or reviewing separately obtained history, performing a medically appropriate examination or evaluation, counseling and educating the patient, and documenting clinical information in the electronic or other health care record   Worthy Rancher, MD

## 2023-07-21 DIAGNOSIS — E669 Obesity, unspecified: Secondary | ICD-10-CM | POA: Insufficient documentation

## 2023-07-21 NOTE — Progress Notes (Signed)
.smr  Office: [phone]  /  Fax: [phone]  WEIGHT SUMMARY AND BIOMETRICS  Vitals Temp: 98.2 F (36.8 C) BP: 120/81 Pulse Rate: 92 SpO2: 97 %   Anthropometric Measurements Height: 5\' 4"  (1.626 m) Weight: 220 lb (99.8 kg) BMI (Calculated): 37.74 Weight at Last Visit: 221 lb Weight Lost Since Last Visit: 1 lb Weight Gained Since Last Visit: 0 Starting Weight: 220 lb Total Weight Loss (lbs): 0 lb (0 kg) Peak Weight: 240 lb   Body Composition  Body Fat %: 53.1 % Fat Mass (lbs): 117 lbs Muscle Mass (lbs): 98.2 lbs Visceral Fat Rating : 16   Other Clinical Data Fasting: yes Labs: no Today's Visit #: 5 Starting Date: 05/07/23     HPI  Chief Complaint: OBESITY  Raiyah is here to discuss her progress with her obesity treatment plan. She is on the keeping a food journal and adhering to recommended goals of 1100 calories and 85 grams of protein and states she is following her eating plan approximately 30-40 % of the time. She states she is exercising [employer]/pool exercising 120 minutes 3 times per week.  Discussed the use of AI scribe software for clinical note transcription with the patient, who gave verbal consent to proceed.  History of Present Illness /  Interval History:  Since last office visit she down 1 lb.   The patient is a 62 year old female with a history of prediabetes, hypertension, vitamin D deficiency, and primary osteoarthritis of both knees. She is currently following an obesity treatment plan. The patient reports struggling with maintaining a healthy diet and exercise routine. She admits to eating out frequently and having a preference for sweets. Despite these challenges, she has managed to lose a pound since her last visit and has been making efforts to improve her diet with the help of her sister. She exercises at the local [employer] at least three times a week, primarily through swimming, which she finds beneficial.  Pharmacotherapy: None for weight  loss  TREATMENT PLAN FOR OBESITY:  Recommended Dietary Goals  Nadea is currently in the action stage of change. As such, her goal is to continue weight management plan. She has agreed to keeping a food journal and adhering to recommended goals of 1100 calories and 85+ grams of  protein.  Behavioral Intervention  We discussed the following Behavioral Modification Strategies today: increasing lean protein intake, decreasing simple carbohydrates , increasing vegetables, increasing lower glycemic fruits, increasing water intake, work on meal planning and preparation, keeping healthy foods at home, decreasing eating out or consumption of processed foods, and making healthy choices when eating convenient foods, emotional eating strategies and understanding the difference between hunger signals and cravings, continue to practice mindfulness when eating, and planning for success.  Additional resources provided today: NA  Recommended Physical Activity Goals  Marriana has been advised to work up to 150 minutes of moderate intensity aerobic activity a week and strengthening exercises 2-3 times per week for cardiovascular health, weight loss maintenance and preservation of muscle mass.   She has agreed to Continue current level of physical activity    Pharmacotherapy We discussed various medication options to help Perline with her weight loss efforts and we both agreed to continue to work on nutritional and behavioral strategies to promote weight loss.  .    Return in about 2 weeks (around 08/05/2023).Marland Kitchen She was informed of the importance of frequent follow up visits to maximize her success with intensive lifestyle modifications for her multiple health conditions.  PHYSICAL EXAM:  Blood pressure 120/81, pulse 92, temperature 98.2 F (36.8 C), height 5\' 4"  (1.626 m), weight 220 lb (99.8 kg), SpO2 97%. Body mass index is 37.76 kg/m.  General: She is overweight, cooperative, alert, well developed, and  in no acute distress. Walks with antalgic appearing gait.  PSYCH: Has normal mood, affect and thought process.   Cardiovascular: HR 90's regular, BP 120/81 Lungs: Normal breathing effort, no conversational dyspnea.  DIAGNOSTIC DATA REVIEWED:  BMET    Component Value Date/Time   NA 142 05/07/2023 0917   K 4.5 05/07/2023 0917   CL 106 05/07/2023 0917   CO2 23 05/07/2023 0917   GLUCOSE 97 05/07/2023 0917   BUN 12 05/07/2023 0917   CREATININE 0.62 05/07/2023 0917   CALCIUM 9.5 05/07/2023 0917   Lab Results  Component Value Date   HGBA1C 5.7 (H) 05/07/2023   HGBA1C 5.9 02/06/2016   Lab Results  Component Value Date   INSULIN 6.1 05/07/2023   Lab Results  Component Value Date   TSH <0.005 (L) 05/07/2023   CBC    Component Value Date/Time   WBC 5.7 05/07/2023 0917   RBC 4.24 05/07/2023 0917   HGB 12.9 05/07/2023 0917   HCT 39.7 05/07/2023 0917   PLT 262 05/07/2023 0917   MCV 94 05/07/2023 0917   MCH 30.4 05/07/2023 0917   MCHC 32.5 05/07/2023 0917   RDW 12.4 05/07/2023 0917   Iron Studies No results found for: "IRON", "TIBC", "FERRITIN", "IRONPCTSAT" Lipid Panel     Component Value Date/Time   CHOL 140 05/07/2023 0917   TRIG 50 05/07/2023 0917   HDL 63 05/07/2023 0917   CHOLHDL 2.9 02/06/2016 1657   VLDL 16 02/06/2016 1657   LDLCALC 66 05/07/2023 0917   Hepatic Function Panel     Component Value Date/Time   PROT 7.6 05/07/2023 0917   ALBUMIN 4.0 05/07/2023 0917   AST 13 05/07/2023 0917   ALT 8 05/07/2023 0917   ALKPHOS 95 05/07/2023 0917   BILITOT 0.7 05/07/2023 0917      Component Value Date/Time   TSH <0.005 (L) 05/07/2023 0917   Nutritional Lab Results  Component Value Date   VD25OH 24.9 (L) 05/07/2023    ASSOCIATED CONDITIONS ADDRESSED TODAY  ASSESSMENT AND PLAN  Problem List Items Addressed This Visit     Primary osteoarthritis of both knees   Relevant Medications   Diclofenac Sodium 1 % CREA   Prediabetes - Primary   Primary  hypertension   Decreased thyroid stimulating hormone (TSH) level   Vitamin D deficiency   Generalized obesity- Start BMI 37.76 05/07/2023   Obesity Patient is actively participating in exercise at the [facility] and has lost 1 pound since last visit. She acknowledges difficulty with dietary choices, particularly with portion control and high sugar intake. She has started to make changes in her diet with the help of her sister. -Increase protein intake to at least 85 grams per day. -Continue regular exercise at the [facility]. -Reduce intake of simple sugars and high carbohydrate foods. -Continue to work on portion control. -Read food labels to understand nutritional content. -Return for follow-up in 2 weeks.  Thyroid Nodules Patient has a history of thyroid nodules and low TSH levels. She has had an ultrasound in the past and was due for a thyroid uptake with imaging in March, but it is unclear if this was completed. She reports no symptoms of hyperthyroidism. -Encouraged to follow up with her primary care provider for further  evaluation of her thyroid function and nodules. -Consider repeat thyroid ultrasound and thyroid uptake with imaging if not completed in March.  Prediabetes Patient's last HbA1c was 5.7, indicating prediabetes. She acknowledges a high intake of simple sugars. -Continue dietary modifications to reduce simple sugar intake. -Continue regular exercise. -Check HbA1c at next visit.  Primary Osteoarthritis of both knees No specific discussion or changes in management during this visit. Continue current management as per primary care provider. Patient requests diclofenac gel for use prn for knee pain and ordered OTC diclofenac gel for use up to 3 times daily.   Prediabetes Last A1c was 5.7- not at goal/ Insulin 6.1- nearing goal  Medication(s): None Polyphagia:No She is working on nutrition plan to decrease simple carbohydrates, increase lean proteins and exercise to promote  weight loss, improve glycemic control and prevent progression to Type 2 diabetes.   Lab Results  Component Value Date   HGBA1C 5.7 (H) 05/07/2023   HGBA1C 5.9 02/06/2016   Lab Results  Component Value Date   INSULIN 6.1 05/07/2023    Plan:  Continue working on nutrition plan to decrease simple carbohydrates, increase lean proteins and exercise to promote weight loss, improve glycemic control and prevent progression to Type 2 diabetes.      Low TSH: Patient was advised to follow up with her PCP- Dr. Ralene Ok for low TSH.  Reports family history of Graves disease.    OA bilateral knees:   ATTESTASTION STATEMENTS:  Reviewed by clinician on day of visit: allergies, medications, problem list, medical history, surgical history, family history, social history, and previous encounter notes.   I have personally spent 40 minutes total time today in preparation, patient care, nutritional counseling and documentation for this visit, including the following: review of clinical lab tests; review of medical tests/procedures/services.      Leighton Luster, PA-C

## 2023-07-22 ENCOUNTER — Ambulatory Visit (INDEPENDENT_AMBULATORY_CARE_PROVIDER_SITE_OTHER): Payer: BC Managed Care – PPO | Admitting: Physician Assistant

## 2023-07-22 ENCOUNTER — Encounter (INDEPENDENT_AMBULATORY_CARE_PROVIDER_SITE_OTHER): Payer: Self-pay | Admitting: Physician Assistant

## 2023-07-22 VITALS — BP 120/81 | HR 92 | Temp 98.2°F | Ht 64.0 in | Wt 220.0 lb

## 2023-07-22 DIAGNOSIS — E669 Obesity, unspecified: Secondary | ICD-10-CM

## 2023-07-22 DIAGNOSIS — M170 Bilateral primary osteoarthritis of knee: Secondary | ICD-10-CM | POA: Diagnosis not present

## 2023-07-22 DIAGNOSIS — Z6838 Body mass index (BMI) 38.0-38.9, adult: Secondary | ICD-10-CM | POA: Insufficient documentation

## 2023-07-22 DIAGNOSIS — I10 Essential (primary) hypertension: Secondary | ICD-10-CM

## 2023-07-22 DIAGNOSIS — Z6837 Body mass index (BMI) 37.0-37.9, adult: Secondary | ICD-10-CM

## 2023-07-22 DIAGNOSIS — E559 Vitamin D deficiency, unspecified: Secondary | ICD-10-CM

## 2023-07-22 DIAGNOSIS — R7303 Prediabetes: Secondary | ICD-10-CM | POA: Diagnosis not present

## 2023-07-22 DIAGNOSIS — E041 Nontoxic single thyroid nodule: Secondary | ICD-10-CM

## 2023-07-22 DIAGNOSIS — R7989 Other specified abnormal findings of blood chemistry: Secondary | ICD-10-CM | POA: Diagnosis not present

## 2023-07-22 MED ORDER — DICLOFENAC SODIUM 1 % EX CREA: 2.0000 g | TOPICAL_CREAM | Freq: Three times a day (TID) | CUTANEOUS | 0 refills | Status: DC | PRN

## 2023-08-04 ENCOUNTER — Ambulatory Visit (INDEPENDENT_AMBULATORY_CARE_PROVIDER_SITE_OTHER): Payer: BC Managed Care – PPO | Admitting: Internal Medicine

## 2023-08-06 ENCOUNTER — Ambulatory Visit (INDEPENDENT_AMBULATORY_CARE_PROVIDER_SITE_OTHER): Payer: BC Managed Care – PPO | Admitting: Physician Assistant

## 2023-08-27 ENCOUNTER — Ambulatory Visit (INDEPENDENT_AMBULATORY_CARE_PROVIDER_SITE_OTHER): Payer: BC Managed Care – PPO | Admitting: Physician Assistant

## 2023-09-10 ENCOUNTER — Ambulatory Visit (INDEPENDENT_AMBULATORY_CARE_PROVIDER_SITE_OTHER): Payer: BC Managed Care – PPO | Admitting: Physician Assistant

## 2023-09-10 ENCOUNTER — Encounter (INDEPENDENT_AMBULATORY_CARE_PROVIDER_SITE_OTHER): Payer: Self-pay | Admitting: Physician Assistant

## 2023-09-10 VITALS — BP 117/83 | HR 78 | Temp 98.2°F | Ht 64.0 in | Wt 222.0 lb

## 2023-09-10 DIAGNOSIS — M170 Bilateral primary osteoarthritis of knee: Secondary | ICD-10-CM

## 2023-09-10 DIAGNOSIS — R7989 Other specified abnormal findings of blood chemistry: Secondary | ICD-10-CM | POA: Diagnosis not present

## 2023-09-10 DIAGNOSIS — E559 Vitamin D deficiency, unspecified: Secondary | ICD-10-CM | POA: Diagnosis not present

## 2023-09-10 DIAGNOSIS — R7303 Prediabetes: Secondary | ICD-10-CM | POA: Diagnosis not present

## 2023-09-10 DIAGNOSIS — E669 Obesity, unspecified: Secondary | ICD-10-CM

## 2023-09-10 DIAGNOSIS — I10 Essential (primary) hypertension: Secondary | ICD-10-CM

## 2023-09-10 DIAGNOSIS — Z6838 Body mass index (BMI) 38.0-38.9, adult: Secondary | ICD-10-CM

## 2023-09-10 MED ORDER — DICLOFENAC SODIUM 1 % EX CREA: 2.0000 g | TOPICAL_CREAM | Freq: Three times a day (TID) | CUTANEOUS | 0 refills | Status: DC | PRN

## 2023-09-10 NOTE — Progress Notes (Signed)
.smr  Office: [phone]  /  Fax: [phone]  WEIGHT SUMMARY AND BIOMETRICS  Vitals Temp: 98.2 F (36.8 C) BP: 117/83 Pulse Rate: 78 SpO2: 99 %   Anthropometric Measurements Height: 5\' 4"  (1.626 m) Weight: 222 lb (100.7 kg) BMI (Calculated): 38.09 Weight at Last Visit: 220 lb Weight Lost Since Last Visit: 0 Weight Gained Since Last Visit: 2 lb Starting Weight: 220 lb Total Weight Loss (lbs): 0 lb (0 kg) Peak Weight: 240 lb   Body Composition  Body Fat %: 51.8 % Fat Mass (lbs): 115.2 lbs Muscle Mass (lbs): 101.8 lbs Total Body Water (lbs): 84.8 lbs Visceral Fat Rating : 16   Other Clinical Data Fasting: yes Labs: no Today's Visit #: 6 Starting Date: 05/07/23     HPI  Chief Complaint: OBESITY  Becky Hall is here to discuss her progress with her obesity treatment plan. She is on the the Category 2 Plan and keeping a food journal and adhering to recommended goals of 1100 calories and 85 grams of  protein and states she is following her eating plan approximately 60 % of the time. She states she is exercising [employer] 45-100 minutes 3 times per week.   Interval History:  Since last office visit she is up 2 lbs.  Bio impedence scale reviewed with the patient:  Muscle mass + 3.6 lbs Adipose mass - 1.8 lbs.   The patient, a 62 year old female with a history of prediabetes, hypertension, vitamin D deficiency, and a very low TSH level, presents for a follow-up of her obesity treatment plan. She reports a slight weight gain but also an increase in muscle mass, which she attributes to her regular workouts at the [facility] and participation in a stretch class. She has been trying to improve her diet, incorporating more fruits and vegetables, and has been taking vitamin D3 supplements. She expresses concern about her thyroid levels, given her family history of Graves' disease. She also mentions a previous episode of a cough for which she took medication.  Plans to see  Dr. Ralene Ok for low TSH level.   Pharmacotherapy: None for weight loss  TREATMENT PLAN FOR OBESITY:  Recommended Dietary Goals  Becky Hall is currently in the action stage of change. As such, her goal is to continue weight management plan. She has agreed to the Category 2 Plan.  Behavioral Intervention  We discussed the following Behavioral Modification Strategies today: continue to work on maintaining a reduced calorie state, getting the recommended amount of protein, incorporating whole foods, making healthy choices, staying well hydrated and practicing mindfulness when eating..  Additional resources provided today: NA  Recommended Physical Activity Goals  Becky Hall has been advised to work up to 150 minutes of moderate intensity aerobic activity a week and strengthening exercises 2-3 times per week for cardiovascular health, weight loss maintenance and preservation of muscle mass.   She has agreed to Continue current level of physical activity  and Increase physical activity in their day and reduce sedentary time (increase NEAT).   Pharmacotherapy We discussed various medication options to help Becky Hall with her weight loss efforts and we both agreed to continue to work on nutritional and behavioral strategies to promote weight loss.    Return in about 4 weeks (around 10/08/2023).Marland Kitchen She was informed of the importance of frequent follow up visits to maximize her success with intensive lifestyle modifications for her multiple health conditions.  PHYSICAL EXAM:  Blood pressure 117/83, pulse 78, temperature 98.2 F (36.8 C), height 5\' 4"  (1.626  m), weight 222 lb (100.7 kg), SpO2 99%. Body mass index is 38.11 kg/m.  General: She is overweight, cooperative, alert, well developed, and in no acute distress. PSYCH: Has normal mood, affect and thought process.   Cardiovascular: HR 70's BP 117/83 Lungs: Normal breathing effort, no conversational dyspnea. Neuro: no focal deficits  DIAGNOSTIC  DATA REVIEWED:  BMET    Component Value Date/Time   NA 142 05/07/2023 0917   K 4.5 05/07/2023 0917   CL 106 05/07/2023 0917   CO2 23 05/07/2023 0917   GLUCOSE 97 05/07/2023 0917   BUN 12 05/07/2023 0917   CREATININE 0.62 05/07/2023 0917   CALCIUM 9.5 05/07/2023 0917   Lab Results  Component Value Date   HGBA1C 5.7 (H) 05/07/2023   HGBA1C 5.9 02/06/2016   Lab Results  Component Value Date   INSULIN 6.1 05/07/2023   Lab Results  Component Value Date   TSH <0.005 (L) 05/07/2023   CBC    Component Value Date/Time   WBC 5.7 05/07/2023 0917   RBC 4.24 05/07/2023 0917   HGB 12.9 05/07/2023 0917   HCT 39.7 05/07/2023 0917   PLT 262 05/07/2023 0917   MCV 94 05/07/2023 0917   MCH 30.4 05/07/2023 0917   MCHC 32.5 05/07/2023 0917   RDW 12.4 05/07/2023 0917   Iron Studies No results found for: "IRON", "TIBC", "FERRITIN", "IRONPCTSAT" Lipid Panel     Component Value Date/Time   CHOL 140 05/07/2023 0917   TRIG 50 05/07/2023 0917   HDL 63 05/07/2023 0917   CHOLHDL 2.9 02/06/2016 1657   VLDL 16 02/06/2016 1657   LDLCALC 66 05/07/2023 0917   Hepatic Function Panel     Component Value Date/Time   PROT 7.6 05/07/2023 0917   ALBUMIN 4.0 05/07/2023 0917   AST 13 05/07/2023 0917   ALT 8 05/07/2023 0917   ALKPHOS 95 05/07/2023 0917   BILITOT 0.7 05/07/2023 0917      Component Value Date/Time   TSH <0.005 (L) 05/07/2023 0917   Nutritional Lab Results  Component Value Date   VD25OH 24.9 (L) 05/07/2023    ASSOCIATED CONDITIONS ADDRESSED TODAY  ASSESSMENT AND PLAN  Problem List Items Addressed This Visit     Primary osteoarthritis of both knees   Relevant Medications   Diclofenac Sodium 1 % CREA   diclofenac (VOLTAREN) 75 MG EC tablet   Prediabetes - Primary   Primary hypertension   Decreased thyroid stimulating hormone (TSH) level   Vitamin D deficiency   Generalized obesity- Start BMI 37.76 05/07/2023   BMI 38.0-38.9,adult  Obesity The patient has  increased muscle mass and decreased fat mass, indicating a positive response to her current exercise regimen. She is also making efforts to improve her diet. -Continue current exercise regimen and dietary improvements. -Return in 4 weeks for follow-up.  Prediabetes Last A1c was 5.7- improved, but in prediabetic range.   Medication(s): None Polyphagia:No She is working on nutrition plan to decrease simple carbohydrates, increase lean proteins and exercise to promote weight loss, improve glycemic control and prevent progression to Type 2 diabetes.   Lab Results  Component Value Date   HGBA1C 5.7 (H) 05/07/2023   HGBA1C 5.9 02/06/2016   Lab Results  Component Value Date   INSULIN 6.1 05/07/2023    Plan:  Continue working on nutrition plan to decrease simple carbohydrates, increase lean proteins and exercise to promote weight loss, improve glycemic control and prevent progression to Type 2 diabetes.  Will plan to recheck labs at  next visit if not done by PCP.    Vitamin D Deficiency Patient has restarted Vitamin D3 supplementation at 4000 IU daily after noticing her deficiency in her medical records. -Continue Vitamin D3 supplementation. -Recheck Vitamin D levels at next visit.  ? Hyperthyroidism/ Low TSH Patient has a history of low TSH levels, indicating possible hyperthyroidism. She has not had recent follow-up with her primary care provider, Dr. Ludwig Clarks. -Encourage patient to schedule an appointment with Dr.Moreira. for further evaluation. -If not evaluated by Dr. Ludwig Clarks., recheck TSH levels at next visit.  Hypertension Hypertension asymptomatic, reasonably well controlled, and no significant medication side effects noted.  Medication(s): Propranolol 60 mg daily She is also taking numerous supplements from the health food store. Magnesium, garlic tablets as well as B complex vitamins.   BP Readings from Last 3 Encounters:  09/10/23 117/83  07/22/23 120/81  07/10/23 107/76    Lab Results  Component Value Date   CREATININE 0.62 05/07/2023   No results found for: "GFR"  Plan: Continue to work on nutrition plan to promote weight loss and improve BP control.  Continue usual medications.    Knee OA Using diclofenac gel and has found to be very helpful to reduce knee pain.  No side effects.  Plan:  Continue/refill diclofenac gel 2 grams  to knees up to 3 times daily for pain.     Follow-up plans -Return in 4 weeks for follow-up. -Check labs at next visit, including Vitamin D and TSH levels. -Encourage patient to schedule an appointment with Dr. Para Skeans for further evaluation of her thyroid condition.  ATTESTASTION STATEMENTS:  Reviewed by clinician on day of visit: allergies, medications, problem list, medical history, surgical history, family history, social history, and previous encounter notes.   I have personally spent 30 minutes total time today in preparation, patient care, nutritional counseling and documentation for this visit, including the following: review of clinical lab tests; review of medical tests/procedures/services.      Dorathea Faerber, PA-C

## 2023-09-26 LAB — TSH: TSH: 0.01 — AB (ref 0.41–5.90)

## 2023-09-26 LAB — HEPATIC FUNCTION PANEL
ALT: 11 U/L (ref 7–35)
AST: 14 (ref 13–35)
Alkaline Phosphatase: 99 (ref 25–125)
Bilirubin, Total: 0.7

## 2023-09-26 LAB — COMPREHENSIVE METABOLIC PANEL
Albumin: 3.8 (ref 3.5–5.0)
Calcium: 9.7 (ref 8.7–10.7)
Globulin: 3.5
eGFR: 104

## 2023-09-26 LAB — BASIC METABOLIC PANEL
BUN: 13 (ref 4–21)
CO2: 22 (ref 13–22)
Chloride: 105 (ref 99–108)
Creatinine: 0.5 (ref 0.5–1.1)
Glucose: 99
Potassium: 4.9 meq/L (ref 3.5–5.1)
Sodium: 140 (ref 137–147)

## 2023-09-26 LAB — LIPID PANEL
Cholesterol: 130 (ref 0–200)
HDL: 64 (ref 35–70)
Triglycerides: 48 (ref 40–160)

## 2023-09-26 LAB — CBC: RBC: 4.06 (ref 3.87–5.11)

## 2023-09-26 LAB — CBC AND DIFFERENTIAL
HCT: 39 (ref 36–46)
Hemoglobin: 12.5 (ref 12.0–16.0)
Neutrophils Absolute: 2.8
Platelets: 237 10*3/uL (ref 150–400)
WBC: 6.2

## 2023-09-26 LAB — HEMOGLOBIN A1C: Hemoglobin A1C: 5.7

## 2023-09-27 LAB — LAB REPORT - SCANNED
A1c: 5.7
EGFR: 104

## 2023-10-07 ENCOUNTER — Other Ambulatory Visit: Payer: Self-pay | Admitting: Internal Medicine

## 2023-10-07 DIAGNOSIS — E0590 Thyrotoxicosis, unspecified without thyrotoxic crisis or storm: Secondary | ICD-10-CM

## 2023-10-08 ENCOUNTER — Other Ambulatory Visit: Payer: Self-pay | Admitting: Internal Medicine

## 2023-10-08 DIAGNOSIS — E0590 Thyrotoxicosis, unspecified without thyrotoxic crisis or storm: Secondary | ICD-10-CM

## 2023-10-09 ENCOUNTER — Ambulatory Visit (INDEPENDENT_AMBULATORY_CARE_PROVIDER_SITE_OTHER): Payer: BC Managed Care – PPO | Admitting: Physician Assistant

## 2023-10-09 ENCOUNTER — Encounter (INDEPENDENT_AMBULATORY_CARE_PROVIDER_SITE_OTHER): Payer: Self-pay | Admitting: Physician Assistant

## 2023-10-09 VITALS — BP 134/88 | HR 65 | Temp 97.4°F | Ht 64.0 in | Wt 221.0 lb

## 2023-10-09 DIAGNOSIS — R7303 Prediabetes: Secondary | ICD-10-CM

## 2023-10-09 DIAGNOSIS — R7989 Other specified abnormal findings of blood chemistry: Secondary | ICD-10-CM | POA: Diagnosis not present

## 2023-10-09 DIAGNOSIS — Z6838 Body mass index (BMI) 38.0-38.9, adult: Secondary | ICD-10-CM

## 2023-10-09 DIAGNOSIS — E669 Obesity, unspecified: Secondary | ICD-10-CM

## 2023-10-09 DIAGNOSIS — I10 Essential (primary) hypertension: Secondary | ICD-10-CM | POA: Diagnosis not present

## 2023-10-09 DIAGNOSIS — M170 Bilateral primary osteoarthritis of knee: Secondary | ICD-10-CM | POA: Diagnosis not present

## 2023-10-09 MED ORDER — DICLOFENAC SODIUM 1 % EX CREA: 2.0000 g | TOPICAL_CREAM | Freq: Three times a day (TID) | CUTANEOUS | 0 refills | Status: AC | PRN

## 2023-10-09 NOTE — Progress Notes (Signed)
SUBJECTIVE:  Chief Complaint: Obesity  Interim History: Becky Hall, a 62 year old patient with a history of prediabetes, vitamin D deficiency, low TSH levels, hypertension, and arthritis of the knees, presents for a follow-up visit regarding her obesity treatment plan.  The patient reports recent dietary changes, including the consumption of [place] nuts and a focus on healthier food choices.  She followed up with her primary care physician Dr. Ralene Hall and had follow-up blood work done at this time which continues to show elevations in her T4 level at 1.94, and TSH unmeasurable at less than 0.005.  Please see the enclosed letter and lab results from Dr. Ludwig Hall. The patient acknowledges that her TSH levels remain low.  Dr. Ludwig Hall has recommended thyroid ablation due to her hyperthyroidism, but she expresses apprehension about the procedure.  We did pull up some YouTube videos outlining the procedure today to hopefully allay some of her concerns and fears about proceeding with thyroid ablation.  Reinforced the potential impact on her heart health with increased risk for cardiomyopathy, A-fib as well as osteoporosis as outlined by her primary care physician.  She is going to continue to think about having the procedure done. The patient expresses concern about a palpable knot in her neck, which she has not previously reported. She also mentions experiencing a tickling sensation in her throat and watery eyes, which she suspects may be related to her thyroid condition. We discussed that she should follow-up with Dr. Ludwig Hall for further evaluation of these issues.  In addition to her thyroid concerns, the patient reports ongoing issues with her knees, which she attributes to arthritis.  She had been working out regularly with pool activities to take stress off her joints, but has not been doing this consistently more recently.  Her decrease in muscle mass over the past few weeks may be due to  a  recent lapse in her strength training routine, and she plans to resume this activity.  The patient's blood pressure readings have improved, which she attributes to her regular workouts. However, she has noticed a recent sensation of warmth, which she suspects may be related to her hyperthyroidism. She has gotten off track with her eating plan and has struggled but reports she is refocusing her efforts in order to get healthy again.  She is working to get back on track with her nutrition plan and exercise.  Becky Hall is here to discuss her progress with her obesity treatment plan. She is on the Category 2 Plan and states she is following her eating plan approximately 40 % of the time. She states she is exercising 90 minutes 3 times per week.   OBJECTIVE: Visit Diagnoses: Problem List Items Addressed This Visit     Primary osteoarthritis of both knees   Relevant Medications   Diclofenac Sodium 1 % CREA   Primary hypertension   Decreased thyroid stimulating hormone (TSH) level - Primary   Generalized obesity- Start BMI 37.76 05/07/2023   BMI 38.0-38.9,adult   Obesity Patient is actively working on weight loss through dietary changes and exercise, including consuming [place] nuts for selenium and healthier foods. Discussed benefits of weight loss on overall health, including blood pressure and joint health. Patient is motivated and engaging in strength training and aquatic exercises. - Continue current dietary plan - Encourage strength training to support knee health - Monitor weight and dietary adherence - Follow up in one month to assess progress   Hyperthyroidism Persistent hyperthyroidism with very low TSH levels. Patient is  reluctant to undergo thyroid ablation due to procedural concerns. Discussed risks of untreated hyperthyroidism, including potential cardiomyopathy and heart damage. Emphasized the importance of treatment to prevent irreversible damage. Explained that radioactive iodine  ablation is typically well-tolerated. Discussed alternative treatment locations [facility] or [facility]) and the importance of timely intervention. - Encourage reconsideration of thyroid ablation and reviewed YouTube video to provide reassurance - Discuss alternative treatment locations [facility] or [facility]) - Monitor TSH levels - Educate on the importance of timely treatment to prevent heart damage  Hypertension Blood pressure today was 134/88 mmHg, slightly elevated but improved from previous readings.  On hydrochlorothiazide 12.5 mg daily and propranolol 60 mg daily . Discussed the impact of hyperthyroidism on blood pressure and the importance of managing both conditions. - Monitor blood pressure regularly - Continue current antihypertensive regimen - Reassess blood pressure at next visit Continue to work on nutrition plan to promote weight loss and improve BP control.   Prediabetes Last A1c was 5.7- labs done by PCP 09/26/23  Medication(s): None Polyphagia:No She is working on a nutrition plan to decrease simple carbohydrates, increase lean proteins and exercise to promote weight loss, improve glycemic control and prevent progression to Type 2 diabetes.   Lab Results  Component Value Date   HGBA1C 5.7 (H) 05/07/2023   HGBA1C 5.9 02/06/2016   Lab Results  Component Value Date   INSULIN 6.1 05/07/2023    Plan:  Continue working on nutrition plan to decrease simple carbohydrates, increase lean proteins and exercise to promote weight loss, improve glycemic control and prevent progression to Type 2 diabetes.  Continue exercise plan to improve further.   Knee Osteoarthritis Patient reports knee weakness and difficulty with certain exercises. Strength training and aquatic exercises have been beneficial. Discussed the importance of muscle strengthening to support joint health.  She has been utilizing diclofenac gel to the knees with beneficial results and would like refills. - Continue  strength training and aquatic exercises - Avoid exercises that strain the knees - Consider physical therapy if symptoms persist Continue and refill diclofenac 1% gel 2 g up to 3 times daily as needed for knee pain  General Health Maintenance Patient is motivated to improve overall health through diet and exercise. Discussed strategies for maintaining a balanced diet and the importance of protein intake, especially with increased physical activity. Provided nutritional guidance and grocery list. Discussed portion control strategies for Thanksgiving. - Encourage a balanced diet with adequate protein intake (85 grams/day) - Provide nutritional guidance and grocery list - Discuss portion control strategies for Thanksgiving - Encourage regular physical activity and strength training  Follow-up - Follow up on December 24th at 8:30 AM.  Vitals Temp: (!) 97.4 F (36.3 C) BP: 134/88 Pulse Rate: 65 SpO2: 98 %   Anthropometric Measurements Height: 5\' 4"  (1.626 m) Weight: 221 lb (100.2 kg) BMI (Calculated): 37.92 Weight at Last Visit: 222 lb Weight Lost Since Last Visit: 1 lb Weight Gained Since Last Visit: 0 Starting Weight: 220 lb Total Weight Loss (lbs): 0 lb (0 kg) Peak Weight: 240 lb   Body Composition  Body Fat %: 53.5 % Fat Mass (lbs): 118.4 lbs Muscle Mass (lbs): 97.6 lbs Visceral Fat Rating : 17   Other Clinical Data Fasting: no Labs: no Today's Visit #: 7 Starting Date: 05/07/23  General: She is overweight, cooperative, alert, well developed, and in no acute distress. Walks with antalgic appearing gait.  PSYCH: Has normal mood, affect and thought process.   Cardiovascular: HR 60's BP 134/88-  reports this is higher than usual, but was rushing to appointment this am.  Lungs: Normal breathing effort, no conversational dyspnea. Neuro: no focal deficits  ASSESSMENT AND PLAN:  Diet: Hai is currently in the action stage of change. As such, her goal is to continue  with weight loss efforts. She has agreed to Category 2 Plan.  Exercise: Nicle has been instructed to work up to a goal of 150 minutes of combined cardio and strengthening exercise per week for weight loss and overall health benefits.   Behavior Modification:  We discussed the following Behavioral Modification Strategies today: increasing lean protein intake, decreasing simple carbohydrates, increasing vegetables, increase H2O intake, increase high fiber foods, better snacking choices, and holiday eating strategies. We discussed various medication options to help Merisa with her weight loss efforts and we both agreed to continue to work on nutritional and behavioral strategies to promote weight loss.  .  Return in about 4 weeks (around 11/06/2023).Marland Kitchen She was informed of the importance of frequent follow up visits to maximize her success with intensive lifestyle modifications for her multiple health conditions.  Attestation Statements:   Reviewed by clinician on day of visit: allergies, medications, problem list, medical history, surgical history, family history, social history, and previous encounter notes.   Time spent on visit including pre-visit chart review and post-visit care and charting was 48 minutes.    Clarisse Rodriges, PA-C

## 2023-10-13 ENCOUNTER — Other Ambulatory Visit (INDEPENDENT_AMBULATORY_CARE_PROVIDER_SITE_OTHER): Payer: Self-pay

## 2023-11-17 NOTE — Progress Notes (Unsigned)
SUBJECTIVE:  Chief Complaint: Obesity  Interim History: She is up 2 lbs since last visit.  Becky Hall, a 62 year old individual with a history of prediabetes, hypertension, and obesity, presents for a follow-up visit regarding her obesity treatment plan. She has been diagnosed with hyperthyroidism, indicated by significantly decreased thyroid stimulating hormone levels. Despite the recommendation for thyroid ablation by her primary care physician, she has not yet proceeded with this treatment.  Recently, she experienced an unusual skin reaction. The day prior to the visit, she noticed a warm, itchy sensation on her left upper arm. She initially attributed this to allergies and took an allergy pill, but the symptoms persisted. She also reported a sore neck, possibly related to the skin issue. She had a similar skin reaction on her toe three months ago, which resolved with calamine lotion application.  Regarding her hyperthyroidism, she reported occasional palpitations and watery eyes, which she attributed to allergies. She expressed reluctance towards the recommended thyroid ablation due to concerns about the recovery period and potential side effects.  In terms of her obesity management, she reported an increase in muscle mass, which she attributed to regular pool exercises. However, she also admitted to recent dietary indiscretions, including consuming multiple cupcakes in one sitting. She expressed a commitment to improving her dietary habits in the new year, with a focus on increasing protein intake. Becky Hall is here to discuss her progress with her obesity treatment plan. She is on the Category 2 Plan and states she is following her eating plan approximately 50 % of the time. She states she is exercising [employer] 60 minutes 3 times per week but not always consistently.   OBJECTIVE: Visit Diagnoses: Problem List Items Addressed This Visit     Prediabetes   Primary hypertension   Decreased  thyroid stimulating hormone (TSH) level - Primary   Generalized obesity- Start BMI 37.76 05/07/2023   BMI 38.0-38.9,adult   Left arm swelling  Obesity Gained 2 pounds with a 1.4-pound increase in muscle mass. Discussed dietary habits, emphasizing protein intake and moderation of sweets. Encouraged continued exercise, particularly swimming. - Provide grocery list for protein-rich foods for Cat 2 and hand out for Cat. 2 plan provided again today.  - Encourage continued exercise, particularly swimming - Advise moderation in consumption of sweets/sugars.   Hyperthyroidism Significantly decreased TSH levels. Discussed risks of untreated hyperthyroidism, including potential cardiac issues. Explained thyroid ablation and its year-long recovery, and compared it to thyroidectomy with higher surgical risks. Patient expressed concerns about the procedure and recovery time. - Encourage proceeding with thyroid ablation - Advise researching more about the procedure - Follow up with primary care physician- Dr. Ludwig Clarks for scheduling  Hypertension Hypertension asymptomatic, no significant medication side effects noted, and borderline controlled.  Medication(s): hydrochlorothiazide 12.5 mg daily   Inderal LA 60 mg daily Renal function in normal range.    BP Readings from Last 3 Encounters:  11/18/23 (!) 142/82  10/09/23 134/88  09/10/23 117/83   Lab Results  Component Value Date   CREATININE 0.5 09/26/2023   CREATININE 0.62 05/07/2023   No results found for: "GFR"  Plan: Continue all antihypertensives at current dosages. Continue to work on nutrition plan to promote weight loss and improve BP control.   Prediabetes Last A1c was 5.7- stable- not at goal. Insulin 6.1 previously- not at goal of < 5.   Medication(s): None Polyphagia:Yes At times increased cravings, but wants to avoid adding any medication at this time.  Lab Results  Component Value  Date   HGBA1C 5.7 09/26/2023   HGBA1C 5.7  (H) 05/07/2023   HGBA1C 5.9 02/06/2016   Lab Results  Component Value Date   INSULIN 6.1 05/07/2023    Plan:  Continue working on nutrition plan to decrease simple carbohydrates, increase lean proteins and exercise to promote weight loss, improve glycemic control and prevent progression to Type 2 diabetes.    Left Upper Arm Warmth and Soreness Reports warmth, soreness, and itching in the left upper arm since yesterday. No fever. Possible insect bite or allergic reaction. Advised urgent care evaluation to rule out infection or other causes. - Visit urgent care for evaluation  Follow-up - Schedule follow-up appointment for Thursday at 10 AM.  Vitals Temp: 97.6 F (36.4 C) BP: (!) 142/82 Pulse Rate: 85 SpO2: 97 %   Anthropometric Measurements Height: 5\' 4"  (1.626 m) Weight: 223 lb (101.2 kg) BMI (Calculated): 38.26 Weight at Last Visit: 221lb Weight Lost Since Last Visit: 0lb Weight Gained Since Last Visit: 2lb Starting Weight: 220 lb Total Weight Loss (lbs): 0 lb (0 kg) Peak Weight: 240 lb   Body Composition  Body Fat %: 53.4 % Fat Mass (lbs): 119.4 lbs Muscle Mass (lbs): 99 lbs Total Body Water (lbs): 81.6 lbs Visceral Fat Rating : 17   Other Clinical Data Fasting: no Labs: no Today's Visit #: 8 Starting Date: 05/07/23     ASSESSMENT AND PLAN:  Diet: [name] is currently in the action stage of change. As such, her goal is to continue with weight loss efforts. She has agreed to Category 2 Plan.  Exercise: [name] has been instructed to work up to a goal of 150 minutes of combined cardio and strengthening exercise per week for weight loss and overall health benefits.   Behavior Modification:  We discussed the following Behavioral Modification Strategies today: increasing lean protein intake, decreasing simple carbohydrates, increasing vegetables, increase H2O intake, increase high fiber foods, meal planning and cooking strategies, emotional eating strategies  , holiday eating strategies, and planning for success. We discussed various medication options to help Kyri with her weight loss efforts and we both agreed to continue to work on nutritional and behavioral strategies to promote weight loss.  .  Return in about 4 weeks (around 12/16/2023).Marland Kitchen She was informed of the importance of frequent follow up visits to maximize her success with intensive lifestyle modifications for her multiple health conditions.  Attestation Statements:   Reviewed by clinician on day of visit: allergies, medications, problem list, medical history, surgical history, family history, social history, and previous encounter notes.   Time spent on visit including pre-visit chart review and post-visit care and charting was *** minutes.    Raiden Haydu, PA-C

## 2023-11-18 ENCOUNTER — Encounter (INDEPENDENT_AMBULATORY_CARE_PROVIDER_SITE_OTHER): Payer: Self-pay | Admitting: Physician Assistant

## 2023-11-18 ENCOUNTER — Ambulatory Visit (INDEPENDENT_AMBULATORY_CARE_PROVIDER_SITE_OTHER): Payer: BC Managed Care – PPO | Admitting: Physician Assistant

## 2023-11-18 VITALS — BP 142/82 | HR 85 | Temp 97.6°F | Ht 64.0 in | Wt 223.0 lb

## 2023-11-18 DIAGNOSIS — M7989 Other specified soft tissue disorders: Secondary | ICD-10-CM | POA: Insufficient documentation

## 2023-11-18 DIAGNOSIS — E669 Obesity, unspecified: Secondary | ICD-10-CM

## 2023-11-18 DIAGNOSIS — R7989 Other specified abnormal findings of blood chemistry: Secondary | ICD-10-CM

## 2023-11-18 DIAGNOSIS — Z6838 Body mass index (BMI) 38.0-38.9, adult: Secondary | ICD-10-CM

## 2023-11-18 DIAGNOSIS — R7303 Prediabetes: Secondary | ICD-10-CM

## 2023-11-18 DIAGNOSIS — I10 Essential (primary) hypertension: Secondary | ICD-10-CM | POA: Diagnosis not present

## 2023-11-19 LAB — LAB REPORT - SCANNED
Calcium: 9.5
EGFR: 99

## 2023-12-17 NOTE — Progress Notes (Unsigned)
SUBJECTIVE:  Chief Complaint: Obesity  Interim History: She has lost 2 lbs since her last visit.   Preethi is here to discuss her progress with her obesity treatment plan. She is on the Category 2 Plan and states she is following her eating plan approximately 40 % of the time. She states she is not exercising 0 minutes 0 times per week.  Interval History:  Since last office visit she is down 2 lbs.  Reports she had a GI bug and was very sick for a few days, but has recovered.  Hunger/appetite-moderate control Cravings- denies excessive cravings, but some indulging in foods off plan over the holidays.  Stress- increased with recent illness Exercise-She has not been going to usual pool work outs with her sister while sick and plans to resume this activity.    She has known hyperthyroidism and her PCP- Dr. Ludwig Clarks recommended iodine ablation therapy, but the patient has been resistant to treatment. We discussed the risks of not having treatment including concerns for heart arrhythmias, heart failure and other concerning issues. The patient has agreed to go and see Dr. Ludwig Clarks and is willing to try methimazole at this time.    OBJECTIVE: Visit Diagnoses: Problem List Items Addressed This Visit     Prediabetes   Primary hypertension - Primary   Decreased thyroid stimulating hormone (TSH) level   Vitamin D deficiency   Generalized obesity- Start BMI 37.76 05/07/2023   Other Visit Diagnoses       BMI 37.0-37.9, adult Current BMI 37.9          Obesity:  Discussed dietary habits, emphasizing protein intake and moderation of sweets. Encouraged continued exercise, particularly swimming. - Provide grocery list for protein-rich foods for Cat 2 and hand out for Cat. 2 plan provided again today.  - Encourage continued exercise, particularly swimming - Advise moderation in consumption of sweets/sugars.   Prediabetes Last A1c was 5.7- nearing goal/ Insulin 6.1 nearing goal of 5   Medication(s): None Polyphagia:Yes- at times.  The patient wishes to continue to work on her nutrition plan to decrease simple carbohydrates, increase lean proteins and exercise to promote weight loss, improve glycemic control and prevent progression to Type 2 diabetes.   Lab Results  Component Value Date   HGBA1C 5.7 09/26/2023   HGBA1C 5.7 (H) 05/07/2023   HGBA1C 5.9 02/06/2016   Lab Results  Component Value Date   INSULIN 6.1 05/07/2023    Plan:  Continue working on nutrition plan to decrease simple carbohydrates, increase lean proteins and exercise to promote weight loss, improve glycemic control and prevent progression to Type 2 diabetes.    Hypertension Hypertension borderline controlled, control uncertain, needs further observation, and needs improvement.  Medication(s): hydrochlorothiazide 12.5 mg daily   Propranolol 60 mg daily Patient is resistant to medications and would like to try to continue to work on nutrition and weight loss and exercise to improve BP control  BP Readings from Last 3 Encounters:  12/18/23 139/87  11/18/23 (!) 142/82  10/09/23 134/88   Lab Results  Component Value Date   CREATININE 0.5 09/26/2023   CREATININE 0.62 05/07/2023   No results found for: "GFR"  Plan: Continue all antihypertensives at current dosages. Continue to work on nutrition plan to promote weight loss and improve BP control.   Hyperthyroidism/ Low TSH: Significantly decreased TSH levels. Discussed risks of untreated hyperthyroidism, including potential cardiac issues. Explained thyroid ablation and its recovery, and compared it to thyroidectomy with higher surgical risks. Patient  expressed concerns about the procedure and recovery time.  She was agreeable to follow up  with Dr. Ludwig Clarks concerning starting methimazole at this time. Monitor with PCP.   Vitamin D Deficiency Vitamin D is not at goal of 50.  Most recent vitamin D level was 24.9. She is on no vitamin D at  this time. Lab Results  Component Value Date   VD25OH 24.9 (L) 05/07/2023    Plan: Start OTC vitamin D3 5000 IU daily Low vitamin D levels can be associated with adiposity and may result in leptin resistance and weight gain. Also associated with fatigue. Currently on vitamin D supplementation without any adverse effects.  Recheck vitamin D level several times yearly to optimize supplementation.   Vitals Temp: 98.3 F (36.8 C) BP: 139/87 Pulse Rate: 63 SpO2: 99 %   Anthropometric Measurements Height: 5\' 4"  (1.626 m) Weight: 221 lb (100.2 kg) BMI (Calculated): 37.92 Weight at Last Visit: 223 lb Weight Lost Since Last Visit: 2 lb Weight Gained Since Last Visit: 0 Starting Weight: 220 lb Total Weight Loss (lbs): 2 lb (0.907 kg) Peak Weight: 240 lb   Body Composition  Body Fat %: 53.5 % Fat Mass (lbs): 118.2 lbs Muscle Mass (lbs): 97.6 lbs Visceral Fat Rating : 16   Other Clinical Data Fasting: no Labs: no Today's Visit #: 9 Starting Date: 05/07/23     ASSESSMENT AND PLAN:  Diet: Tierny is currently in the action stage of change. As such, her goal is to continue with weight loss efforts. She has agreed to Category 2 Plan.  Exercise: Laveya has been instructed to work up to a goal of 150 minutes of combined cardio and strengthening exercise per week for weight loss and overall health benefits.   Behavior Modification:  We discussed the following Behavioral Modification Strategies today: increasing lean protein intake, decreasing simple carbohydrates, increasing vegetables, increase H2O intake, increase high fiber foods, meal planning and cooking strategies, keeping healthy foods in the home, avoiding temptations, and planning for success. We discussed various medication options to help Saquana with her weight loss efforts and we both agreed to continue to work on nutritional and behavioral strategies to promote weight loss.  She has agreed to follow up with Dr.  Ludwig Clarks concerning her hyperthyroidism.  Return in about 4 weeks (around 01/15/2024).Marland Kitchen She was informed of the importance of frequent follow up visits to maximize her success with intensive lifestyle modifications for her multiple health conditions.  Attestation Statements:   Reviewed by clinician on day of visit: allergies, medications, problem list, medical history, surgical history, family history, social history, and previous encounter notes.   Time spent on visit including pre-visit chart review and post-visit care and charting was 38 minutes.    Mayco Walrond, PA-C

## 2023-12-18 ENCOUNTER — Encounter (INDEPENDENT_AMBULATORY_CARE_PROVIDER_SITE_OTHER): Payer: Self-pay | Admitting: Physician Assistant

## 2023-12-18 ENCOUNTER — Ambulatory Visit (INDEPENDENT_AMBULATORY_CARE_PROVIDER_SITE_OTHER): Payer: HMO | Admitting: Physician Assistant

## 2023-12-18 VITALS — BP 139/87 | HR 63 | Temp 98.3°F | Ht 64.0 in | Wt 221.0 lb

## 2023-12-18 DIAGNOSIS — E559 Vitamin D deficiency, unspecified: Secondary | ICD-10-CM | POA: Diagnosis not present

## 2023-12-18 DIAGNOSIS — R7989 Other specified abnormal findings of blood chemistry: Secondary | ICD-10-CM

## 2023-12-18 DIAGNOSIS — R7303 Prediabetes: Secondary | ICD-10-CM

## 2023-12-18 DIAGNOSIS — I10 Essential (primary) hypertension: Secondary | ICD-10-CM | POA: Diagnosis not present

## 2023-12-18 DIAGNOSIS — Z6837 Body mass index (BMI) 37.0-37.9, adult: Secondary | ICD-10-CM

## 2023-12-18 DIAGNOSIS — E669 Obesity, unspecified: Secondary | ICD-10-CM

## 2024-01-19 ENCOUNTER — Ambulatory Visit (INDEPENDENT_AMBULATORY_CARE_PROVIDER_SITE_OTHER): Payer: 59 | Admitting: Physician Assistant

## 2024-02-09 NOTE — Progress Notes (Unsigned)
 SUBJECTIVE: Discussed the use of AI scribe software for clinical note transcription with the patient, who gave verbal consent to proceed.  Chief Complaint: Obesity  Interim History: She is up 3 lbs since her last visit in Jan. 2025 Struggling with consistently following plan, exercising consistently and with follow up in office.   Becky Hall is here to discuss her progress with her obesity treatment plan. She is on the Category 2 Plan and states she is following her eating plan approximately 40 % of the time. She states she is not exercising 0 minutes 0 times per week.  Becky Hall is a 63 year old female who presents for follow-up of her obesity treatment plan.  She has gained three pounds since her last visit. She has not been exercising recently but plans to resume her routine of swimming at the pool, which she usually does on Monday, Wednesday, and Friday. She follows her category two nutrition plan approximately forty percent of the time and faces challenges in maintaining consistency, especially when socializing after church on Sundays. We discussed working on meal planning and prepping to improve adherence to her diet plan.  She has a history of prediabetes and hypertension. She is not currently taking hydrochlorothiazide, which she had been on previously. She is exploring dietary changes to manage her hypertension, including incorporating foods like salmon and reducing sugar intake.  She has a very low TSH level and is pending treatment, including radioactive ablation, but has been hesitant to proceed. She is awaiting further blood tests to determine the next steps in her thyroid management. Her thyroid condition affects her sleep, causing her to wake up frequently during the night.  She prefers to manage her medical issues primarily with vitamins and supplements. She is currently taking vitamin D, magnesium, and garlic pills, which she obtained from a health food store. She drinks  lemon water daily and avoids sweetened beverages.  OBJECTIVE: Visit Diagnoses: Problem List Items Addressed This Visit     Prediabetes - Primary   Primary hypertension   Decreased thyroid stimulating hormone (TSH) level   Vitamin D deficiency   Generalized obesity- Start BMI 37.76 05/07/2023   BMI 38.0-38.9,adult  Obesity Becky Hall, a 63 year old female with obesity, has gained three pounds since her last visit. She has not been exercising but plans to resume. She follows her nutrition plan approximately 40% of the time and prefers managing her medical issues with vitamins and supplements. Emphasized the importance of consistency in diet and exercise for weight management and overall health. Discussed challenges of maintaining a healthy diet, especially when dining out, and benefits of meal planning and prepping. - Encourage resumption of exercise, aiming for at least two days a week initially. - Advise on meal planning and prepping to improve adherence to the nutrition plan. - Discuss the importance of making healthier food choices, especially when dining out. - Encourage the use of an air fryer for healthier cooking options.  Hypertension Blood pressure is 136/79 mmHg. Becky Hall has hypertension but is not currently taking hydrochlorothiazide. Discussed the importance of dietary modifications, such as reducing sugar intake and choosing foods beneficial for hypertension management, like salmon. Emphasized the potential risk of developing heart problems, such as atrial fibrillation, if hypertension is not managed effectively. - Encourage dietary modifications to manage hypertension, including reducing sugar intake and incorporating foods like salmon. - Discuss the potential need to resume hydrochlorothiazide with her regular doctor.  Hyperthyroidism/Thyroid Disorder (Low TSH) Becky Hall has a very low TSH level  and is pending treatment, including radioactive ablation, but she has been hesitant to  proceed. Discussed the potential benefits of the iodine treatment for better focus and overall health. Emphasized the importance of addressing thyroid issues before considering knee surgery due to potential complications with hypertension and heart health. - Follow up with Doctor Ludwig Clarks regarding thyroid management and potential ablation treatment vs. Methimazole.  We again discussed potential risks of untreated hyperthyroidism including potential for cardiac complications.  - Encourage regular monitoring of thyroid function tests.  Prediabetes Becky Hall has prediabetes. Emphasized the importance of dietary management, particularly reducing intake of high-sugar foods and drinks, to prevent progression to diabetes. - Encourage dietary modifications to manage prediabetes, focusing on reducing sugar intake and choosing low-glycemic index foods.  Vitamin D Deficiency Becky Hall has vitamin D deficiency and is taking vitamin D supplements 2000 units daily. Advised ensuring her regular doctor is aware of all supplements she is taking. - Continue vitamin D supplementation. - Ensure regular doctor is informed of all supplements being taken.  General Health Maintenance Discussed general dietary recommendations, including the benefits of fruits and vegetables, and the importance of meal planning and prepping for overall health maintenance. Advised on choosing lower sugar fruits and avoiding high sugar fruits and processed fruit products. - Encourage meal planning and prepping to improve dietary adherence. - Advise on choosing lower sugar fruits and avoiding high sugar fruits and processed fruit products.  Follow-up She is planning to see her PCP-Dr. Ludwig Clarks over the next week with follow up labs at that time. Asked that she  Plans to see Danaye back in one month to reassess her progress and any lab results from her regular doctor. - Schedule follow-up appointment in one month. - Bring any lab results from  regular doctor to the next appointment.  Vitals Temp: 98.9 F (37.2 C) BP: 136/79 Pulse Rate: 72 SpO2: 99 %   Anthropometric Measurements Height: 5\' 4"  (1.626 m) Weight: 224 lb (101.6 kg) BMI (Calculated): 38.43 Weight at Last Visit: 221 lb Weight Lost Since Last Visit: 0 Weight Gained Since Last Visit: 3 lb Starting Weight: 220 lb Total Weight Loss (lbs): 0 lb (0 kg) Peak Weight: 240 lb   Body Composition  Body Fat %: 54.7 % Fat Mass (lbs): 122.8 lbs Muscle Mass (lbs): 96.6 lbs Visceral Fat Rating : 17   Other Clinical Data Fasting: no Labs: no Today's Visit #: 10 Starting Date: 05/07/23     ASSESSMENT AND PLAN:  Diet: Evamarie is currently in the action stage of change. As such, her goal is to continue with weight loss efforts and has agreed to the Category 2 Plan.   Exercise:  For substantial health benefits, adults should do at least 150 minutes (2 hours and 30 minutes) a week of moderate-intensity, or 75 minutes (1 hour and 15 minutes) a week of vigorous-intensity aerobic physical activity, or an equivalent combination of moderate- and vigorous-intensity aerobic activity. Aerobic activity should be performed in episodes of at least 10 minutes, and preferably, it should be spread throughout the week.  Behavior Modification:  We discussed the following Behavioral Modification Strategies today: increasing lean protein intake, decreasing simple carbohydrates, increasing vegetables, increase H2O intake, decreasing eating out, meal planning and cooking strategies, keeping healthy foods in the home, family/coworker sabotage, avoiding temptations, and planning for success. We discussed various medication options to help Corena with her weight loss efforts and we both agreed to continue to work on nutritional and behavioral strategies to promote weight loss.  Marland Kitchen  Return in about 4 weeks (around 03/09/2024).Marland Kitchen She was informed of the importance of frequent follow up visits to  maximize her success with intensive lifestyle modifications for her multiple health conditions.  Attestation Statements:   Reviewed by clinician on day of visit: allergies, medications, problem list, medical history, surgical history, family history, social history, and previous encounter notes.   Time spent on visit including pre-visit chart review and post-visit care and charting was 44 minutes  Teila Skalsky,PA-C

## 2024-02-10 ENCOUNTER — Ambulatory Visit (INDEPENDENT_AMBULATORY_CARE_PROVIDER_SITE_OTHER): Payer: 59 | Admitting: Physician Assistant

## 2024-02-10 ENCOUNTER — Encounter (INDEPENDENT_AMBULATORY_CARE_PROVIDER_SITE_OTHER): Payer: Self-pay | Admitting: Physician Assistant

## 2024-02-10 VITALS — BP 136/79 | HR 72 | Temp 98.9°F | Ht 64.0 in | Wt 224.0 lb

## 2024-02-10 DIAGNOSIS — Z6838 Body mass index (BMI) 38.0-38.9, adult: Secondary | ICD-10-CM

## 2024-02-10 DIAGNOSIS — E559 Vitamin D deficiency, unspecified: Secondary | ICD-10-CM | POA: Diagnosis not present

## 2024-02-10 DIAGNOSIS — R7303 Prediabetes: Secondary | ICD-10-CM | POA: Diagnosis not present

## 2024-02-10 DIAGNOSIS — E669 Obesity, unspecified: Secondary | ICD-10-CM

## 2024-02-10 DIAGNOSIS — R7989 Other specified abnormal findings of blood chemistry: Secondary | ICD-10-CM | POA: Diagnosis not present

## 2024-02-10 DIAGNOSIS — I10 Essential (primary) hypertension: Secondary | ICD-10-CM

## 2024-03-15 ENCOUNTER — Ambulatory Visit (INDEPENDENT_AMBULATORY_CARE_PROVIDER_SITE_OTHER): Admitting: Physician Assistant

## 2024-03-15 NOTE — Progress Notes (Deleted)
   SUBJECTIVE: Discussed the use of AI scribe software for clinical note transcription with the patient, who gave verbal consent to proceed.  Chief Complaint: Obesity  Interim History: ***  Becky Hall is here to discuss her progress with her obesity treatment plan. She is on the {HWW Weight Loss Plan:210964005} and states she {CHL AMB IS/IS NOT:210130109} following her eating plan approximately *** % of the time. She states she {CHL AMB IS/IS NOT:210130109} exercising *** minutes *** times per week.   OBJECTIVE: Visit Diagnoses: Problem List Items Addressed This Visit     Prediabetes - Primary   Primary hypertension   Decreased thyroid  stimulating hormone (TSH) level   Vitamin D  deficiency   Generalized obesity- Start BMI 37.76 05/07/2023    No data recorded No data recorded No data recorded No data recorded   ASSESSMENT AND PLAN:  Diet: [place] {CHL AMB IS/IS NOT:210130109} currently in the action stage of change. As such, her goal is to {HWW Weight Loss Efforts:210964006}. She {HAS HAS GEX:52841} agreed to {HWW Weight Loss Plan:210964005}.  Exercise: Kelsi has been instructed {HWW Exercise:210964007} for weight loss and overall health benefits.   Behavior Modification:  We discussed the following Behavioral Modification Strategies today: {HWW Behavior Modification:210964008}. We discussed various medication options to help Peggi with her weight loss efforts and we both agreed to ***.  No follow-ups on file.Aaron Aas She was informed of the importance of frequent follow up visits to maximize her success with intensive lifestyle modifications for her multiple health conditions.  Attestation Statements:   Reviewed by clinician on day of visit: allergies, medications, problem list, medical history, surgical history, family history, social history, and previous encounter notes.   Time spent on visit including pre-visit chart review and post-visit care and charting was *** minutes.     Jacques Willingham, PA-C

## 2024-03-23 NOTE — Progress Notes (Signed)
 SUBJECTIVE: Discussed the use of AI scribe software for clinical note transcription with the patient, who gave verbal consent to proceed.  Chief Complaint: Obesity  Interim History: She is down 1 lb since her last visit.   Becky Hall is here to discuss her progress with her obesity treatment plan. She is on the Category 2 Plan and states she is following her eating plan approximately 30 % of the time. She states she is not exercising but is working in her yard frequently.  Becky Hall is a 63 year old female who presents for follow-up of her obesity treatment plan.  She has been actively managing her obesity through dietary changes and increased physical activity, resulting in recent weight loss. Her diet now includes more fruits like watermelon and oranges, and she has reduced sugar intake by avoiding chocolate chip cookies. She engages in physical activities such as yard work, which she believes has contributed to her weight loss and increased muscle mass.  She has a history of hyperthyroidism and is currently experiencing an elevated heart rate of 107 beats per minute, which she attributes to her thyroid  condition. HR < 100 after resting for a few minutes. She has been off her thyroid  medication- methimazole,  but plans to resume it. She recently received a steroid injection for allergy symptoms, which she believes increased her energy levels and may also be contributing to her increased HR. She experiences cravings for things like watermelon or oranges lately.   She has a history of prediabetes and hypertension. Her blood pressure was recorded at 107/72 mmHg. She is cautious about her sugar intake due to her prediabetes, avoiding high-sugar foods and monitoring her diet closely.  She reports recent allergy symptoms, including hoarseness and coughing, which she attributes to pollen exposure. She received a steroid injection for these symptoms and has been using honey and lemon to soothe her  throat.  She mentions her skin has been breaking out, which she suspects might be related to sugar intake. She also reports scratching her skin, particularly when working in the yard.  She discusses her dietary habits, including a preference for watermelon and oranges, and mentions she has not been consuming protein bars or eggs recently. She acknowledges the need to increase her protein intake. OBJECTIVE: Visit Diagnoses: Problem List Items Addressed This Visit     Prediabetes   Primary hypertension   Decreased thyroid  stimulating hormone (TSH) level - Primary   Generalized obesity- Start BMI 37.76 05/07/2023   BMI 38.0-38.9,adult  Obesity Weight management is ongoing. She has lost weight and increased muscle mass through physical activity, such as yard work. Dietary intake includes fruits like watermelon and oranges, but protein intake may be insufficient. She is avoiding high-sugar foods and focusing on hydration with water and ice. - Encourage increased protein intake.  Provided hand outs for Category 2 plan and reviewed plan to encourage increased protein intake consistently. Discussed getting back to the pool for exercise.    Hyperthyroidism She has been off methimazole but plans to resume it. Symptoms include tachycardia and unusual cravings. Risks of untreated hyperthyroidism include arrhythmias and other serious complications. She is hesitant about thyroid  ablation but understands the need for treatment to prevent cardiac issues. Her PCP- Dr. Cricket Doll recommended iodine ablation therapy, but the patient has been resistant to treatment. We discussed the risks of not having treatment including concerns for heart arrhythmias, heart failure and other concerning issues. The patient has agreed to go and see Dr. Cricket Doll and  is willing to resume methimazole at this time.  - Contact her PCP- Dr. Edda Goo to resume methimazole. - Consider thyroid  ablation if medication is  ineffective.   Prediabetes Last A1c was 5.7- not at goal. Insulin  6.1- almost to goal of < 5  Medication(s): None Polyphagia:No Lab Results  Component Value Date   HGBA1C 5.7 09/26/2023   HGBA1C 5.7 (H) 05/07/2023   HGBA1C 5.9 02/06/2016   Lab Results  Component Value Date   INSULIN  6.1 05/07/2023    Plan:  Continue working on nutrition plan to decrease simple carbohydrates, increase lean proteins and exercise to promote weight loss, improve glycemic control and prevent progression to Type 2 diabetes.  She is eating some local honey for her recent sinus symptoms and we discussed limiting the amount due to high glycemic index.   Hypertension Blood pressure is 107/72 mmHg, within normal range. Tachycardia at 107 bpm is likely related to hyperthyroidism and recent steroid injection, which can increase heart rate and provide temporary energy boost. BP Readings from Last 3 Encounters:  03/24/24 104/72  02/10/24 136/79  12/18/23 139/87      Vitals Temp: 97.6 F (36.4 C) BP: 104/72 Pulse Rate: (!) 107 SpO2: 97 %   Anthropometric Measurements Height: 5\' 4"  (1.626 m) Weight: 223 lb (101.2 kg) BMI (Calculated): 38.26 Weight at Last Visit: 224 lb Weight Lost Since Last Visit: 1 lb Weight Gained Since Last Visit: 0 Starting Weight: 220 lb Total Weight Loss (lbs): 0 lb (0 kg) Peak Weight: 240 lb   Body Composition  Body Fat %: 53.4 % Fat Mass (lbs): 119.4 lbs Muscle Mass (lbs): 99 lbs Visceral Fat Rating : 17   Other Clinical Data Fasting: no Labs: no Today's Visit #: 11 Starting Date: 05/07/23     ASSESSMENT AND PLAN:  Diet: Nytia is currently in the action stage of change. As such, her goal is to continue with weight loss efforts. She has agreed to Category 2 Plan.  Exercise: Corean has been instructed to work up to a goal of 150 minutes of combined cardio and strengthening exercise per week for weight loss and overall health benefits.   Behavior  Modification:  We discussed the following Behavioral Modification Strategies today: increasing lean protein intake, decreasing simple carbohydrates, increasing vegetables, increase H2O intake, increase high fiber foods, no skipping meals, meal planning and cooking strategies, avoiding temptations, and planning for success. We discussed various medication options to help Shaghayegh with her weight loss efforts and we both agreed to continue current treatment plan and continue to work on nutritional and behavioral strategies to promote weight loss.  .  Return in about 6 weeks (around 05/05/2024).Aaron Aas She was informed of the importance of frequent follow up visits to maximize her success with intensive lifestyle modifications for her multiple health conditions.  Attestation Statements:   Reviewed by clinician on day of visit: allergies, medications, problem list, medical history, surgical history, family history, social history, and previous encounter notes.   Time spent on visit including pre-visit chart review and post-visit care and charting was 26 minutes.    Sylwia Cuervo, PA-C

## 2024-03-24 ENCOUNTER — Encounter (INDEPENDENT_AMBULATORY_CARE_PROVIDER_SITE_OTHER): Payer: Self-pay | Admitting: Physician Assistant

## 2024-03-24 ENCOUNTER — Ambulatory Visit (INDEPENDENT_AMBULATORY_CARE_PROVIDER_SITE_OTHER): Admitting: Physician Assistant

## 2024-03-24 VITALS — BP 104/72 | HR 107 | Temp 97.6°F | Ht 64.0 in | Wt 223.0 lb

## 2024-03-24 DIAGNOSIS — Z6838 Body mass index (BMI) 38.0-38.9, adult: Secondary | ICD-10-CM

## 2024-03-24 DIAGNOSIS — E669 Obesity, unspecified: Secondary | ICD-10-CM

## 2024-03-24 DIAGNOSIS — R7303 Prediabetes: Secondary | ICD-10-CM | POA: Diagnosis not present

## 2024-03-24 DIAGNOSIS — I10 Essential (primary) hypertension: Secondary | ICD-10-CM

## 2024-03-24 DIAGNOSIS — R7989 Other specified abnormal findings of blood chemistry: Secondary | ICD-10-CM

## 2024-03-24 DIAGNOSIS — E559 Vitamin D deficiency, unspecified: Secondary | ICD-10-CM

## 2024-05-04 ENCOUNTER — Encounter (INDEPENDENT_AMBULATORY_CARE_PROVIDER_SITE_OTHER): Payer: Self-pay | Admitting: Adult Health

## 2024-05-04 ENCOUNTER — Ambulatory Visit (INDEPENDENT_AMBULATORY_CARE_PROVIDER_SITE_OTHER): Admitting: Adult Health

## 2024-05-04 VITALS — BP 110/77 | HR 89 | Temp 98.2°F | Ht 64.0 in | Wt 226.0 lb

## 2024-05-04 DIAGNOSIS — I10 Essential (primary) hypertension: Secondary | ICD-10-CM | POA: Diagnosis not present

## 2024-05-04 DIAGNOSIS — Z6838 Body mass index (BMI) 38.0-38.9, adult: Secondary | ICD-10-CM

## 2024-05-04 DIAGNOSIS — R7303 Prediabetes: Secondary | ICD-10-CM | POA: Diagnosis not present

## 2024-05-04 DIAGNOSIS — E669 Obesity, unspecified: Secondary | ICD-10-CM

## 2024-05-04 DIAGNOSIS — E559 Vitamin D deficiency, unspecified: Secondary | ICD-10-CM | POA: Diagnosis not present

## 2024-05-04 DIAGNOSIS — R7989 Other specified abnormal findings of blood chemistry: Secondary | ICD-10-CM

## 2024-05-04 NOTE — Progress Notes (Signed)
 WEIGHT SUMMARY AND BIOMETRICS  Vitals Temp: 98.2 F (36.8 C) BP: 110/77 Pulse Rate: 89 SpO2: 99 %   Anthropometric Measurements Height: 5\' 4"  (1.626 m) Weight: 226 lb (102.5 kg) BMI (Calculated): 38.77 Weight at Last Visit: 223 lb Weight Lost Since Last Visit: 0 Weight Gained Since Last Visit: 3 lb Starting Weight: 220 lb Total Weight Loss (lbs): 0 lb (0 kg) Peak Weight: 240 lb   Body Composition  Body Fat %: 54.2 % Fat Mass (lbs): 122.4 lbs Muscle Mass (lbs): 98.4 lbs Visceral Fat Rating : 17   Other Clinical Data Fasting: no Labs: no Today's Visit #: 12 Starting Date: 05/07/23    Chief Complaint:   OBESITY Becky Hall is here to discuss her progress with her obesity treatment plan.  She is on the the Category 2 Plan and states she is following her eating plan approximately 20 % of the time.  She states she is exercising: None Will start out patient Physical Therapy (PT) tomorrow, re: bil knee pain  Interim History:   Cravings- Becky Hall endorses increased snacking on "junk food", ie: cheetos, chips She estimates to eat out 3-4 times per week, usually at [employer]  Exercise-Will start PT tomorrow at [facility] in [place]  Hydration-she estimates to drink 2 x 16.9 oz water day She reports increased intake of sweet tea  She is an employee of [employer]- [employer] She has been out on disability since August 2024 for worsening bil knee pain.  Subjective:   1. Prediabetes Lab Results  Component Value Date   HGBA1C 5.7 09/26/2023   HGBA1C 5.7 (H) 05/07/2023   HGBA1C 5.9 02/06/2016    She is not currently on any antidiabetic medications She endorses increased junk food and eating out the last several weeks  2. Decreased thyroid  stimulating hormone (TSH) level She was seen by her PCP/ Becky Goo, MD last week Per pt- labs complete and she was restarted on daily Methimazole 5mg - SHE HAS NOT RESTARTED YET Per pt, she believes to have been  off Methimazole for > 12 months  3. Primary hypertension BP excellent at OV She denies CP with exertion  4. Vitamin D  deficiency  Latest Reference Range & Units 05/07/23 09:17  Vitamin D , 25-Hydroxy 30.0 - 100.0 ng/mL 24.9 (L)  (L): Data is abnormally low  PCP is managing weekly Ergocaliciferol- denies N/V/Muscle Weakness  Assessment/Plan:   1. Prediabetes (Primary) Increase protein Limit sugar/simple CHO Reviewed specifics of Cat 2 MP in detail at OV  2. Decreased thyroid  stimulating hormone (TSH) level Restart Methimazole 5mg  per PCP  3. Primary hypertension Increase plain water intake Remain as active as tolerated, re: bil knee pain  4. Vitamin D  deficiency F/u with PCP  5. BMI 38.0-38.9,adult Current BMI 38.8  Becky Hall is currently in the action stage of change. As such, her goal is to get back to weightloss efforts . She has agreed to the Category 2 Plan.   Eating Out Guide provided with calorie range/protein goals for each meal.  Exercise goals: Out Patient PT  Behavioral modification strategies: increasing lean protein intake, decreasing simple carbohydrates, increasing vegetables, increasing water intake, decreasing eating out, no skipping meals, meal planning and cooking strategies, keeping healthy foods in the home, ways to avoid boredom eating, ways to avoid night time snacking, better snacking choices, planning for success, and decreasing junk food.  Becky Hall has agreed to follow-up with our clinic in 3 weeks. She was informed of the importance of  frequent follow-up visits to maximize her success with intensive lifestyle modifications for her multiple health conditions.   Objective:   Blood pressure 110/77, pulse 89, temperature 98.2 F (36.8 C), height 5\' 4"  (1.626 m), weight 226 lb (102.5 kg), SpO2 99%. Body mass index is 38.79 kg/m.  General: Cooperative, alert, well developed, in no acute distress. HEENT: Conjunctivae and lids unremarkable. Cardiovascular:  Regular rhythm.  Lungs: Normal work of breathing. Neurologic: No focal deficits.   Lab Results  Component Value Date   CREATININE 0.5 09/26/2023   BUN 13 09/26/2023   NA 140 09/26/2023   K 4.9 09/26/2023   CL 105 09/26/2023   CO2 22 09/26/2023   Lab Results  Component Value Date   ALT 11 09/26/2023   AST 14 09/26/2023   ALKPHOS 99 09/26/2023   BILITOT 0.7 05/07/2023   Lab Results  Component Value Date   HGBA1C 5.7 09/26/2023   HGBA1C 5.7 (H) 05/07/2023   HGBA1C 5.9 02/06/2016   Lab Results  Component Value Date   INSULIN  6.1 05/07/2023   Lab Results  Component Value Date   TSH 0.01 (A) 09/26/2023   Lab Results  Component Value Date   CHOL 130 09/26/2023   HDL 64 09/26/2023   LDLCALC 66 05/07/2023   TRIG 48 09/26/2023   CHOLHDL 2.9 02/06/2016   Lab Results  Component Value Date   VD25OH 24.9 (L) 05/07/2023   Lab Results  Component Value Date   WBC 6.2 09/26/2023   HGB 12.5 09/26/2023   HCT 39 09/26/2023   MCV 94 05/07/2023   PLT 237 09/26/2023   No results found for: "IRON", "TIBC", "FERRITIN"  Attestation Statements:   Reviewed by clinician on day of visit: allergies, medications, problem list, medical history, surgical history, family history, social history, and previous encounter notes.  "28 minutes spent face-to-face with the patient discussing management of disordered eating symptoms, reviewing current medications, and providing strategies for coping with emotional eating."  I have reviewed the above documentation for accuracy and completeness, and I agree with the above. -  Roshad Hack d. Remington Skalsky, NP-C

## 2024-06-10 ENCOUNTER — Ambulatory Visit (INDEPENDENT_AMBULATORY_CARE_PROVIDER_SITE_OTHER): Admitting: Adult Health

## 2024-06-10 ENCOUNTER — Encounter (INDEPENDENT_AMBULATORY_CARE_PROVIDER_SITE_OTHER): Payer: Self-pay | Admitting: Adult Health

## 2024-06-10 VITALS — BP 119/79 | HR 69 | Temp 98.7°F | Ht 64.0 in | Wt 227.0 lb

## 2024-06-10 DIAGNOSIS — E559 Vitamin D deficiency, unspecified: Secondary | ICD-10-CM | POA: Diagnosis not present

## 2024-06-10 DIAGNOSIS — Z6838 Body mass index (BMI) 38.0-38.9, adult: Secondary | ICD-10-CM

## 2024-06-10 DIAGNOSIS — E669 Obesity, unspecified: Secondary | ICD-10-CM | POA: Diagnosis not present

## 2024-06-10 DIAGNOSIS — R7303 Prediabetes: Secondary | ICD-10-CM | POA: Diagnosis not present

## 2024-06-10 DIAGNOSIS — I10 Essential (primary) hypertension: Secondary | ICD-10-CM

## 2024-06-10 MED ORDER — VITAMIN D (ERGOCALCIFEROL) 1.25 MG (50000 UNIT) PO CAPS: 50000.0000 [IU] | ORAL_CAPSULE | ORAL | 0 refills | Status: AC

## 2024-06-10 NOTE — Progress Notes (Signed)
 WEIGHT SUMMARY AND BIOMETRICS  Vitals Temp: 98.7 F (37.1 C) BP: 119/79 Pulse Rate: 69 SpO2: 94 %   Anthropometric Measurements Height: 5' 4 (1.626 m) Weight: 227 lb (103 kg) BMI (Calculated): 38.95 Weight at Last Visit: 226 lb Weight Lost Since Last Visit: 0 Weight Gained Since Last Visit: 1 lb Starting Weight: 220 lb Total Weight Loss (lbs): 0 lb (0 kg) Peak Weight: 240 lb   Body Composition  Body Fat %: 54.1 % Fat Mass (lbs): 122.8 lbs Muscle Mass (lbs): 99 lbs Visceral Fat Rating : 17   Other Clinical Data Fasting: no Labs: no Today's Visit #: 13 Starting Date: 05/07/23    Chief Complaint:   OBESITY Becky Hall is here to discuss her progress with her obesity treatment plan. She is on the the Category 2 Plan and states she is following her eating plan approximately 30 % of the time.  She states she is exercising [employer]- Water Aerobics/Stretching 60 minutes 1 times per week.  Interim History:  Reviewed Bioimpedance Results with pt: Muscle Mass: +0.6 lb Adipose Mass: +0.4 lb  Exercise-She resumed water aerobics at [employer]. She went once this week and hopes to increase to 2-3 times weekly.  Hydration-she estimates to drink 2 bottles of water daily  She has been minimally following Cat 2 MP, estimates only 30%  Subjective:   1. Prediabetes Lab Results  Component Value Date   HGBA1C 5.7 09/26/2023   HGBA1C 5.7 (H) 05/07/2023   HGBA1C 5.9 02/06/2016    A1c remains in prediabetic range She is not currently on any antidiabetic medications  2. Primary hypertension BP excellent at OV PCP manages hydrochlorothiazide (MICROZIDE) 12.5 MG capsule  propranolol ER (INDERAL LA) 60 MG 24 hr capsule  Renal function in normal range.  Recommend updating labs  3. Vitamin D  deficiency  Latest Reference Range & Units 05/07/23 09:17  Vitamin D , 25-Hydroxy 30.0 - 100.0 ng/mL 24.9 (L)  (L): Data is abnormally low  Vit D level well below goal of 50-70 She is on  weekly Ergocalciferol - denies N/V/Muscle Weakness  Assessment/Plan:   1. Prediabetes (Primary) Increase compliance of Cat 2 MP to at least 80% Continue regular exercise  2. Primary hypertension Increase compliance of Cat 2 MP to at least 80% Continue regular exercise  3. Vitamin D  deficiency Refill Vitamin D , Ergocalciferol , (DRISDOL ) 1.25 MG (50000 UNIT) CAPS capsule Take 1 capsule (50,000 Units total) by mouth once a week. Dispense: 4 capsule, Refills: 0 ordered   4. BMI 38.0-38.9,adult Current BMI 38.8  Becky Hall is not currently in the action stage of change. As such, her goal is to get back to weightloss efforts . She has agreed to the Category 2 Plan.   Exercise goals: Older adults should follow the adult guidelines. When older adults cannot meet the adult guidelines, they should be as physically active as their abilities and conditions will allow.  Older adults should do exercises that maintain or improve balance if they are at risk of falling.  Older adults should determine their level of effort for physical activity relative to their level of fitness.  Older adults with chronic conditions should understand whether and how their conditions affect their ability to do regular physical activity safely.  Behavioral modification strategies: increasing lean protein intake, decreasing simple carbohydrates, increasing vegetables, increasing water intake, decreasing eating out, no skipping meals, meal planning and cooking strategies, keeping healthy foods in the home, ways to avoid boredom eating, better snacking choices, dealing with family  or coworker sabotage, planning for success, and decreasing junk food.  Becky Hall has agreed to follow-up with our clinic in 4 weeks. She was informed of the importance of frequent follow-up visits to maximize her success with intensive lifestyle modifications for her multiple health conditions.   Check Fasting Labs at next OV  Objective:   Blood pressure  119/79, pulse 69, temperature 98.7 F (37.1 C), height 5' 4 (1.626 m), weight 227 lb (103 kg), SpO2 94%. Body mass index is 38.96 kg/m.  General: Cooperative, alert, well developed, in no acute distress. HEENT: Conjunctivae and lids unremarkable. Cardiovascular: Regular rhythm.  Lungs: Normal work of breathing. Neurologic: No focal deficits.   Lab Results  Component Value Date   CREATININE 0.5 09/26/2023   BUN 13 09/26/2023   NA 140 09/26/2023   K 4.9 09/26/2023   CL 105 09/26/2023   CO2 22 09/26/2023   Lab Results  Component Value Date   ALT 11 09/26/2023   AST 14 09/26/2023   ALKPHOS 99 09/26/2023   BILITOT 0.7 05/07/2023   Lab Results  Component Value Date   HGBA1C 5.7 09/26/2023   HGBA1C 5.7 (H) 05/07/2023   HGBA1C 5.9 02/06/2016   Lab Results  Component Value Date   INSULIN  6.1 05/07/2023   Lab Results  Component Value Date   TSH 0.01 (A) 09/26/2023   Lab Results  Component Value Date   CHOL 130 09/26/2023   HDL 64 09/26/2023   LDLCALC 66 05/07/2023   TRIG 48 09/26/2023   CHOLHDL 2.9 02/06/2016   Lab Results  Component Value Date   VD25OH 24.9 (L) 05/07/2023   Lab Results  Component Value Date   WBC 6.2 09/26/2023   HGB 12.5 09/26/2023   HCT 39 09/26/2023   MCV 94 05/07/2023   PLT 237 09/26/2023   No results found for: IRON, TIBC, FERRITIN  Attestation Statements:   Reviewed by clinician on day of visit: allergies, medications, problem list, medical history, surgical history, family history, social history, and previous encounter notes.  I have reviewed the above documentation for accuracy and completeness, and I agree with the above. -  Josee Speece d. Tashanti Dalporto, NP-C

## 2024-07-08 ENCOUNTER — Ambulatory Visit (INDEPENDENT_AMBULATORY_CARE_PROVIDER_SITE_OTHER): Admitting: Adult Health

## 2024-08-11 ENCOUNTER — Ambulatory Visit (INDEPENDENT_AMBULATORY_CARE_PROVIDER_SITE_OTHER): Admitting: Adult Health

## 2024-08-17 ENCOUNTER — Ambulatory Visit (INDEPENDENT_AMBULATORY_CARE_PROVIDER_SITE_OTHER): Admitting: Adult Health

## 2024-08-22 NOTE — Progress Notes (Unsigned)
 SUBJECTIVE: Discussed the use of AI scribe software for clinical note transcription with the patient, who gave verbal consent to proceed.  Chief Complaint: Obesity  Interim History: Becky Hall is up 2 lbs since her last visit.    Becky Hall is here to discuss her progress with her obesity treatment plan. Becky Hall is on the Category 2 Plan and states Becky Hall is following her eating plan approximately 40 % of the time. Becky Hall states Becky Hall is not exercising due to pool being closed.  Becky Hall is a 63 year old female who presents for follow-up of her obesity treatment plan.  Becky Hall has a history of prediabetes, hypertension, vitamin D  deficiency, and hyperthyroidism. Becky Hall has gained two pounds since her last visit and is following a category two obesity treatment plan with about forty percent adherence.  Becky Hall faces challenges in maintaining her exercise routine due to changes at her gym and financial constraints in accessing a personal trainer. Becky Hall wants to increase her physical activity and has explored options for free exercise classes.  Becky Hall is currently taking medication for hyperthyroidism mehimazole- three times a day and feels 'pretty good'. Becky Hall experiences symptoms of being 'wide open' and anxious, which Becky Hall attributes to her thyroid  condition. Becky Hall has been getting more sleep lately, typically going to bed around 8 PM and waking up around 12 AM, then falling back asleep until 5 AM.  Her dietary habits include consuming protein drinks, boiled eggs, salads, and occasionally indulging in sweets. Becky Hall mentions a recent lapse in her diet with a visit to [employer] for ice cream and a strawberry cream pie. Becky Hall is trying to incorporate more protein into her diet and is considering trying lactose-free milk options.  Becky Hall uses a prescription eye medication, polymyxin, for an eye condition that was previously causing puffiness and a scratching sensation. Becky Hall notes improvement in her symptoms with the  medication.  OBJECTIVE: Visit Diagnoses: Problem List Items Addressed This Visit     Prediabetes - Primary   Primary hypertension   Decreased thyroid  stimulating hormone (TSH) level   Vitamin D  deficiency   Generalized obesity- Start BMI 37.76 05/07/2023   Other Visit Diagnoses       BMI 39.0-39.9,adult Current BMI 39.3         Obesity Ongoing management with a recent weight gain of two pounds. Currently following a category two plan with approximately forty percent adherence. - Encourage participation in free exercise classes through [employer] program - Advise on creating a structured daily schedule for exercise and activities. - Discuss nutritional habits and encourage healthier eating choices.  Prediabetes Management includes dietary modifications and exercise. Emphasized protein intake and hydration. Lab Results  Component Value Date   HGBA1C 5.7 09/26/2023   HGBA1C 5.7 (H) 05/07/2023   HGBA1C 5.9 02/06/2016   Lab Results  Component Value Date   LDLCALC 66 05/07/2023   CREATININE 0.5 09/26/2023   INSULIN   Date Value Ref Range Status  05/07/2023 6.1 2.6 - 24.9 uIU/mL Final  ]Continue working on nutrition plan to decrease simple carbohydrates, increase lean proteins and exercise to promote weight loss, improve glycemic control and prevent progression to Type 2 diabetes.  Becky Hall reports recent labs with her PCP- Dr. Valma and will bring in labs to next visit.  - Encourage dietary modifications to include adequate protein intake (85 grams per day). - Advise on increasing hydration to at least 80 ounces of fluid daily. - Discuss healthier snack options, such as dark chocolate and  controlled portions of granola.  Hyperthyroidism Managed with methimazole- prescribed by Dr. Valma. Becky Hall reports feeling well on medication three times a day. Follow-up with Dr. Valma is scheduled for December. - Continue current thyroid  medication as prescribed by  Dr. Valma. - Follow up with Dr. Valma in December.  Essential hypertension Hypertension is being monitored. On hydrochlorothiazide 12.5 mg daily.  BP Readings from Last 3 Encounters:  08/23/24 128/87  06/10/24 119/79  05/04/24 110/77   Lab Results  Component Value Date   NA 140 09/26/2023   CL 105 09/26/2023   K 4.9 09/26/2023   CO2 22 09/26/2023   BUN 13 09/26/2023   CREATININE 0.5 09/26/2023   EGFR 99.0 11/18/2023   CALCIUM 9.5 11/18/2023   ALBUMIN 3.8 09/26/2023   GLUCOSE 97 05/07/2023   Continue to work on nutrition plan to promote weight loss and improve BP control.     Vitamin D  deficiency Treated with supplementation. Recently received a new prescription for vitamin D .- Ergocalciferol  50,000 units once weekly. Just started current RX without any SE. No N/V or muscle weakness. - - Continue vitamin D  supplementation as prescribed. ERgocalciferol  50,000 units once weekly - Plan to recheck vitamin D  levels at a future visit.  Vitals Temp: 98.3 F (36.8 C) BP: 128/87 Pulse Rate: 96 SpO2: 97 %   Anthropometric Measurements Height: 5' 4 (1.626 m) Weight: 229 lb (103.9 kg) BMI (Calculated): 39.29 Weight at Last Visit: 227 lb Weight Lost Since Last Visit: 0 Weight Gained Since Last Visit: 2 lb Starting Weight: 220 lb Total Weight Loss (lbs): 0 lb (0 kg)   Body Composition  Body Fat %: 54.7 % Fat Mass (lbs): 125.4 lbs Muscle Mass (lbs): 98.6 lbs Visceral Fat Rating : 17   Other Clinical Data Fasting: No Labs: No Today's Visit #: 14     ASSESSMENT AND PLAN:  Diet: Becky Hall is currently in the action stage of change. As such, her goal is to get back to weight loss efforts. Becky Hall has agreed to Category 2 Plan.  Exercise: Becky Hall has been instructed to work up to a goal of 150 minutes of combined cardio and strengthening exercise per week, to try a geriatric exercise plan, and that some exercise is better than none for weight loss and overall health  benefits.   Behavior Modification:  We discussed the following Behavioral Modification Strategies today: increasing lean protein intake, decreasing simple carbohydrates, increasing vegetables, increase H2O intake, increase high fiber foods, no skipping meals, meal planning and cooking strategies, better snacking choices, avoiding temptations, and planning for success. We discussed various medication options to help Becky Hall with her weight loss efforts and we both agreed to continue current treatment plan.  Return in about 4 weeks (around 09/20/2024).Becky Hall Becky Hall was informed of the importance of frequent follow up visits to maximize her success with intensive lifestyle modifications for her multiple health conditions.  Attestation Statements:   Reviewed by clinician on day of visit: allergies, medications, problem list, medical history, surgical history, family history, social history, and previous encounter notes.   Time spent on visit including pre-visit chart review and post-visit care and charting was 36 minutes.    Alicianna Litchford, PA-C

## 2024-08-23 ENCOUNTER — Encounter (INDEPENDENT_AMBULATORY_CARE_PROVIDER_SITE_OTHER): Payer: Self-pay | Admitting: Physician Assistant

## 2024-08-23 ENCOUNTER — Ambulatory Visit (INDEPENDENT_AMBULATORY_CARE_PROVIDER_SITE_OTHER): Admitting: Physician Assistant

## 2024-08-23 VITALS — BP 128/87 | HR 96 | Temp 98.3°F | Ht 64.0 in | Wt 229.0 lb

## 2024-08-23 DIAGNOSIS — E669 Obesity, unspecified: Secondary | ICD-10-CM

## 2024-08-23 DIAGNOSIS — E559 Vitamin D deficiency, unspecified: Secondary | ICD-10-CM

## 2024-08-23 DIAGNOSIS — R7303 Prediabetes: Secondary | ICD-10-CM

## 2024-08-23 DIAGNOSIS — I10 Essential (primary) hypertension: Secondary | ICD-10-CM

## 2024-08-23 DIAGNOSIS — Z6839 Body mass index (BMI) 39.0-39.9, adult: Secondary | ICD-10-CM

## 2024-08-23 DIAGNOSIS — R7989 Other specified abnormal findings of blood chemistry: Secondary | ICD-10-CM | POA: Diagnosis not present

## 2024-10-03 NOTE — Progress Notes (Unsigned)
   SUBJECTIVE: Discussed the use of AI scribe software for clinical note transcription with the patient, who gave verbal consent to proceed.  Chief Complaint: Obesity  Interim History: ***  Becky Hall is here to discuss her progress with her obesity treatment plan. She is on the {HWW Weight Loss Plan:210964005} and states she {CHL AMB IS/IS NOT:210130109} following her eating plan approximately *** % of the time. She states she {CHL AMB IS/IS NOT:210130109} exercising *** minutes *** times per week.   OBJECTIVE: Visit Diagnoses: Problem List Items Addressed This Visit     Prediabetes - Primary   Primary hypertension   Decreased thyroid  stimulating hormone (TSH) level   Vitamin D  deficiency   Generalized obesity- Start BMI 37.76 05/07/2023    No data recorded No data recorded No data recorded No data recorded   ASSESSMENT AND PLAN:  Diet: Becky Hall {CHL AMB IS/IS NOT:210130109} currently in the action stage of change. As such, her goal is to {HWW Weight Loss Efforts:210964006}. She {HAS HAS GEX:52841} agreed to {HWW Weight Loss Plan:210964005}.  Exercise: Becky Hall has been instructed {HWW Exercise:210964007} for weight loss and overall health benefits.   Behavior Modification:  We discussed the following Behavioral Modification Strategies today: {HWW Behavior Modification:210964008}. We discussed various medication options to help Becky Hall with her weight loss efforts and we both agreed to ***.  No follow-ups on file.Aaron Aas She was informed of the importance of frequent follow up visits to maximize her success with intensive lifestyle modifications for her multiple health conditions.  Attestation Statements:   Reviewed by clinician on day of visit: allergies, medications, problem list, medical history, surgical history, family history, social history, and previous encounter notes.   Time spent on visit including pre-visit chart review and post-visit care and charting was *** minutes.     Jacques Willingham, PA-C

## 2024-10-04 ENCOUNTER — Ambulatory Visit (INDEPENDENT_AMBULATORY_CARE_PROVIDER_SITE_OTHER): Payer: Self-pay | Admitting: Physician Assistant

## 2024-10-04 DIAGNOSIS — R7989 Other specified abnormal findings of blood chemistry: Secondary | ICD-10-CM

## 2024-10-04 DIAGNOSIS — E559 Vitamin D deficiency, unspecified: Secondary | ICD-10-CM

## 2024-10-04 DIAGNOSIS — R7303 Prediabetes: Secondary | ICD-10-CM

## 2024-10-04 DIAGNOSIS — E669 Obesity, unspecified: Secondary | ICD-10-CM

## 2024-10-04 DIAGNOSIS — I10 Essential (primary) hypertension: Secondary | ICD-10-CM

## 2024-10-12 ENCOUNTER — Ambulatory Visit (INDEPENDENT_AMBULATORY_CARE_PROVIDER_SITE_OTHER): Admitting: Physician Assistant

## 2024-11-10 ENCOUNTER — Ambulatory Visit (INDEPENDENT_AMBULATORY_CARE_PROVIDER_SITE_OTHER): Admitting: Physician Assistant

## 2024-11-12 ENCOUNTER — Other Ambulatory Visit (INDEPENDENT_AMBULATORY_CARE_PROVIDER_SITE_OTHER): Payer: Self-pay | Admitting: Adult Health

## 2024-12-09 LAB — BASIC METABOLIC PANEL WITH GFR
BUN: 9 (ref 4–21)
CO2: 29 — AB (ref 13–22)
Chloride: 106 (ref 99–108)
Creatinine: 0.4 — AB (ref 0.5–1.1)
Glucose: 102
Potassium: 4.5 meq/L (ref 3.5–5.1)
Sodium: 139 (ref 137–147)

## 2024-12-09 LAB — COMPREHENSIVE METABOLIC PANEL WITH GFR
Albumin: 3.6 (ref 3.5–5.0)
Calcium: 9.5 (ref 8.7–10.7)
Globulin: 3.7
eGFR: 110

## 2024-12-09 LAB — HEPATIC FUNCTION PANEL
ALT: 9 U/L (ref 7–35)
AST: 14 (ref 13–35)
Alkaline Phosphatase: 90 (ref 25–125)
Bilirubin, Total: 0.8

## 2024-12-09 LAB — CBC: RBC: 4.27 (ref 3.87–5.11)

## 2024-12-09 LAB — TSH: TSH: 0.01 — AB (ref 0.41–5.90)

## 2024-12-09 LAB — CBC AND DIFFERENTIAL
HCT: 39 (ref 36–46)
Hemoglobin: 13.1 (ref 12.0–16.0)
Neutrophils Absolute: 2380
Platelets: 228 10*3/uL (ref 150–400)
WBC: 5

## 2024-12-14 ENCOUNTER — Ambulatory Visit (INDEPENDENT_AMBULATORY_CARE_PROVIDER_SITE_OTHER): Admitting: Physician Assistant

## 2024-12-14 ENCOUNTER — Encounter (INDEPENDENT_AMBULATORY_CARE_PROVIDER_SITE_OTHER): Payer: Self-pay | Admitting: Physician Assistant

## 2024-12-14 VITALS — BP 135/85 | HR 98 | Temp 98.1°F | Ht 64.0 in | Wt 229.0 lb

## 2024-12-14 DIAGNOSIS — R7303 Prediabetes: Secondary | ICD-10-CM | POA: Diagnosis not present

## 2024-12-14 DIAGNOSIS — Z6839 Body mass index (BMI) 39.0-39.9, adult: Secondary | ICD-10-CM | POA: Diagnosis not present

## 2024-12-14 DIAGNOSIS — E669 Obesity, unspecified: Secondary | ICD-10-CM

## 2024-12-14 DIAGNOSIS — I10 Essential (primary) hypertension: Secondary | ICD-10-CM | POA: Diagnosis not present

## 2024-12-14 DIAGNOSIS — E559 Vitamin D deficiency, unspecified: Secondary | ICD-10-CM

## 2024-12-14 DIAGNOSIS — E0590 Thyrotoxicosis, unspecified without thyrotoxic crisis or storm: Secondary | ICD-10-CM

## 2024-12-14 DIAGNOSIS — R7989 Other specified abnormal findings of blood chemistry: Secondary | ICD-10-CM

## 2024-12-14 NOTE — Progress Notes (Signed)
 "  SUBJECTIVE: Discussed the use of AI scribe software for clinical note transcription with the patient, who gave verbal consent to proceed.  Chief Complaint: Obesity  Interim History: Last in office visit 07/2024   Has not been following her nutrition plan and wants to get back on track.   Weight is up 9 lbs from her initial weight when she originally started with [facility] on 05/07/2023.   Becky Hall is here to discuss her progress with her obesity treatment plan. She is on the Category 2 Plan and states she is following her eating plan approximately 10 % of the time. She states she is exercising chair exercises 10  minutes 7 times per week. She saw Dr. Valma last week and had extensive labs done and will bring into the office over the next week for review.  Becky Hall is a 64 year old female who presents for obesity treatment management.  She has been following a category two plan for obesity management approximately ten percent of the time. She focuses on protein intake but does not consume adequate water. She engages in chair exercises for ten minutes daily and has maintained her weight since her last visit in September 2025. She has a sweet tooth and struggles with sugar intake, particularly with granola bars and sugary drinks like sodas. She is trying to improve her diet by incorporating more fruits like pears and apples.  She has a history of prediabetes and primary hypertension, currently managed with hydrochlorothiazide 12.5 mg daily. She also has hyperthyroidism, for which she takes methimazole. For arthritis, she takes meloxicam , usually half a pill as needed, and omega-3 supplements.  She has vitamin D  deficiency and is on ergocalciferol  50,000 units once weekly. She recently visited another doctor, had labs drawn, and mentioned being congested during that visit, for which she received a shot.  OBJECTIVE: Visit Diagnoses: Problem List Items Addressed This Visit     Prediabetes -  Primary   Primary hypertension   Decreased thyroid  stimulating hormone (TSH) level   Vitamin D  deficiency   Generalized obesity- Start BMI 37.76 05/07/2023   Other Visit Diagnoses       BMI 39.0-39.9,adult Current BMI 39.3         Obesity Management is ongoing with a focus on dietary modifications. She has been following a category two plan only 10% of the time, focusing on protein intake but not drinking adequate water. She maintains her weight since the last visit in September 2025. She engages in chair exercises for ten minutes seven days per week. There is a need to refocus on dietary habits, particularly reducing sugar intake and increasing protein consumption. - Encouraged adherence to the category two plan. - Advised increasing water intake. - Recommended focusing on protein intake and reducing sugar consumption. - Provided a grocery list and dietary guidance. - Encouraged reading food labels to identify hidden sugars. - Suggested using protein shakes if meals are skipped, but prioritize eating meals over drinking them.  Primary hypertension Managed with hydrochlorothiazide 12.5 mg daily.No SE.  BP Readings from Last 3 Encounters:  12/14/24 135/85  08/23/24 128/87  06/10/24 119/79   Continue to work on nutrition plan to promote weight loss and improve BP control.  Continue hydrochlorothiazide 12.5 mg daily.   Hyperthyroidism Managed with methimazole 5 mg TID per PCP. Reports labs rechecked last week.  Will follow up on labs. Continue methimazole 5 mg TID.  Vitamin D  deficiency Managed with ergocalciferol  50,000 units once weekly.Reports no N/V  or muscle weakness with Ergocalciferol  . Reports labs checked by PCP last week.  Low vitamin D  levels can be associated with adiposity and may result in leptin resistance and weight gain. Also associated with fatigue.  Currently on vitamin D  supplementation without any adverse effects such as nausea, vomiting or muscle weakness.   Plan: Will await labs from PCP and continue Ergocalciferol  50,000 units once weekly and adjust supplementation or refill as indicated by lab. If level not checked, will check next OV.   Prediabetes Management focuses on dietary modifications to prevent progression to diabetes. Emphasis on reducing sugar intake and increasing protein consumption. On no medication currently. Prefers dietary management Labs checked by PCP and await labs. Plan A1c and other labs if not checked.  Lab Results  Component Value Date   HGBA1C 5.7 09/26/2023   HGBA1C 5.7 (H) 05/07/2023   HGBA1C 5.9 02/06/2016   Lab Results  Component Value Date   LDLCALC 66 05/07/2023   CREATININE 0.5 09/26/2023   Continue working on nutrition plan to decrease simple carbohydrates, increase lean proteins and exercise to promote weight loss, improve glycemic control and prevent progression to Type 2 diabetes.  - Advised reducing sugar intake and increasing protein consumption. - Provided dietary guidance and grocery list. -Provided list of common alternate names for sugars in foods.   Vitals Temp: 98.1 F (36.7 C) BP: 135/85 Pulse Rate: 98 SpO2: 97 %   Anthropometric Measurements Height: 5' 4 (1.626 m) Weight: 229 lb (103.9 kg) BMI (Calculated): 39.29 Weight at Last Visit: 229 lb Weight Lost Since Last Visit: 0 Weight Gained Since Last Visit: 0 Starting Weight: 220 lb Total Weight Loss (lbs): 0 lb (0 kg) Peak Weight: 240 lb   Body Composition  Body Fat %: 54.8 % Fat Mass (lbs): 125.6 lbs Muscle Mass (lbs): 98.4 lbs Visceral Fat Rating : 18   Other Clinical Data Fasting: No Labs: No Today's Visit #: 15 Starting Date: 05/07/23     ASSESSMENT AND PLAN:  Diet: Becky Hall is currently in the action stage of change. As such, her goal is to get back to weight loss efforts. She has agreed to Category 2 Plan.  Exercise: Becky Hall has been instructed to work up to a goal of 150 minutes of combined cardio and  strengthening exercise per week, to try a geriatric exercise plan, and that some exercise is better than none for weight loss and overall health benefits.   Behavior Modification:  We discussed the following Behavioral Modification Strategies today: increasing lean protein intake, decreasing simple carbohydrates, increasing vegetables, increase H2O intake, decrease liquid calories, increase high fiber foods, no skipping meals, meal planning and cooking strategies, better snacking choices, emotional eating strategies , family/coworker sabotage, avoiding temptations, planning for success, and decrease junk food . We discussed various medication options to help Becky Hall with her weight loss efforts and we both agreed to resume working on nutritional and behavioral strategies to promote weight loss.  .  Return in about 4 weeks (around 01/11/2025).Becky Hall She was informed of the importance of frequent follow up visits to maximize her success with intensive lifestyle modifications for her multiple health conditions.  Attestation Statements:   Reviewed by clinician on day of visit: allergies, medications, problem list, medical history, surgical history, family history, social history, and previous encounter notes.   Time spent on visit including pre-visit chart review and post-visit care and charting was 34 minutes.    Lenell Mcconnell, PA-C  "

## 2024-12-16 ENCOUNTER — Other Ambulatory Visit: Payer: Self-pay | Admitting: Internal Medicine

## 2024-12-16 DIAGNOSIS — K760 Fatty (change of) liver, not elsewhere classified: Secondary | ICD-10-CM

## 2024-12-28 ENCOUNTER — Encounter (INDEPENDENT_AMBULATORY_CARE_PROVIDER_SITE_OTHER): Payer: Self-pay | Admitting: Physician Assistant

## 2025-01-12 ENCOUNTER — Ambulatory Visit (INDEPENDENT_AMBULATORY_CARE_PROVIDER_SITE_OTHER): Admitting: Physician Assistant
